# Patient Record
Sex: Male | Born: 1935 | Race: Black or African American | Hispanic: No | Smoking: Former smoker
Health system: Southern US, Community
[De-identification: ages and names within clinical notes are randomized; demographics above are authoritative.]

## PROBLEM LIST (undated history)

## (undated) DIAGNOSIS — E785 Hyperlipidemia, unspecified: Secondary | ICD-10-CM

## (undated) DIAGNOSIS — I480 Paroxysmal atrial fibrillation: Secondary | ICD-10-CM

## (undated) DIAGNOSIS — K219 Gastro-esophageal reflux disease without esophagitis: Secondary | ICD-10-CM

## (undated) DIAGNOSIS — I10 Essential (primary) hypertension: Secondary | ICD-10-CM

## (undated) DIAGNOSIS — E119 Type 2 diabetes mellitus without complications: Secondary | ICD-10-CM

## (undated) DIAGNOSIS — H409 Unspecified glaucoma: Secondary | ICD-10-CM

## (undated) HISTORY — DX: Unspecified glaucoma: H40.9

## (undated) HISTORY — DX: Type 2 diabetes mellitus without complications: E11.9

## (undated) HISTORY — DX: Hyperlipidemia, unspecified: E78.5

## (undated) HISTORY — DX: Essential (primary) hypertension: I10

## (undated) HISTORY — DX: Paroxysmal atrial fibrillation: I48.0

## (undated) HISTORY — DX: Gastro-esophageal reflux disease without esophagitis: K21.9

---

## 2004-04-09 ENCOUNTER — Emergency Department: Payer: Self-pay | Admitting: Emergency Medicine

## 2004-05-01 ENCOUNTER — Ambulatory Visit: Payer: Self-pay

## 2004-05-16 ENCOUNTER — Ambulatory Visit: Payer: Self-pay

## 2005-06-17 ENCOUNTER — Inpatient Hospital Stay: Payer: Self-pay | Admitting: Internal Medicine

## 2005-06-17 ENCOUNTER — Other Ambulatory Visit: Payer: Self-pay

## 2005-06-19 ENCOUNTER — Other Ambulatory Visit: Payer: Self-pay

## 2005-06-30 ENCOUNTER — Emergency Department: Payer: Self-pay | Admitting: Emergency Medicine

## 2006-04-20 ENCOUNTER — Ambulatory Visit: Payer: Self-pay | Admitting: Family Medicine

## 2007-05-19 ENCOUNTER — Ambulatory Visit: Payer: Self-pay | Admitting: Family Medicine

## 2009-09-13 ENCOUNTER — Ambulatory Visit: Payer: Self-pay | Admitting: Family Medicine

## 2009-10-11 ENCOUNTER — Ambulatory Visit: Payer: Self-pay | Admitting: Family Medicine

## 2012-03-27 IMAGING — CR ORBITS FOR FOREIGN BODY - 2 VIEW
1 series · 6 of 6 positions shown · non-contrast
Comparison: none

REASON FOR EXAM: METALLIC FOREIGH BODY FOR MRI CLEARANCE
COMMENTS:

PROCEDURE:     DXR - DXR ORBITS FOR MRI CLEARANCE  - October 11, 2009  [DATE]
RESULT:     Three views of the orbits reveal no evidence of retained
metallic foreign bodies. The bony structures are grossly normal.

[Series 1: view not recorded · 0.17mm/px · 6 of 6 slices shown]
[im 1/6]
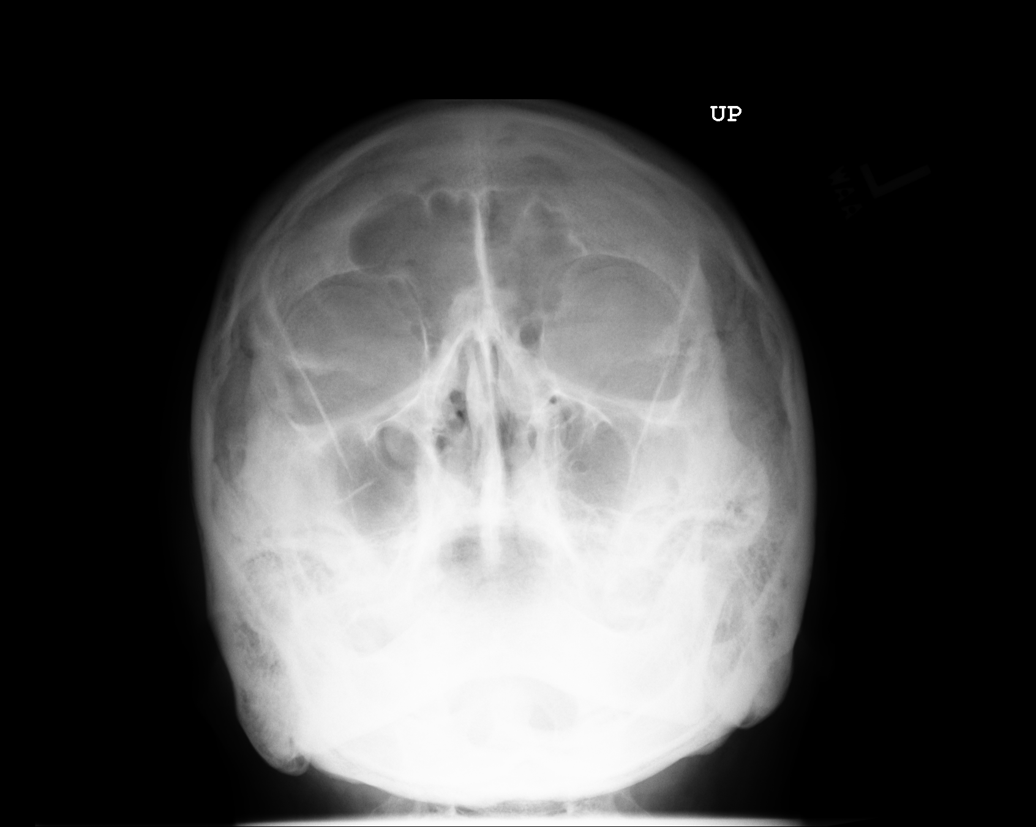
[im 2/6]
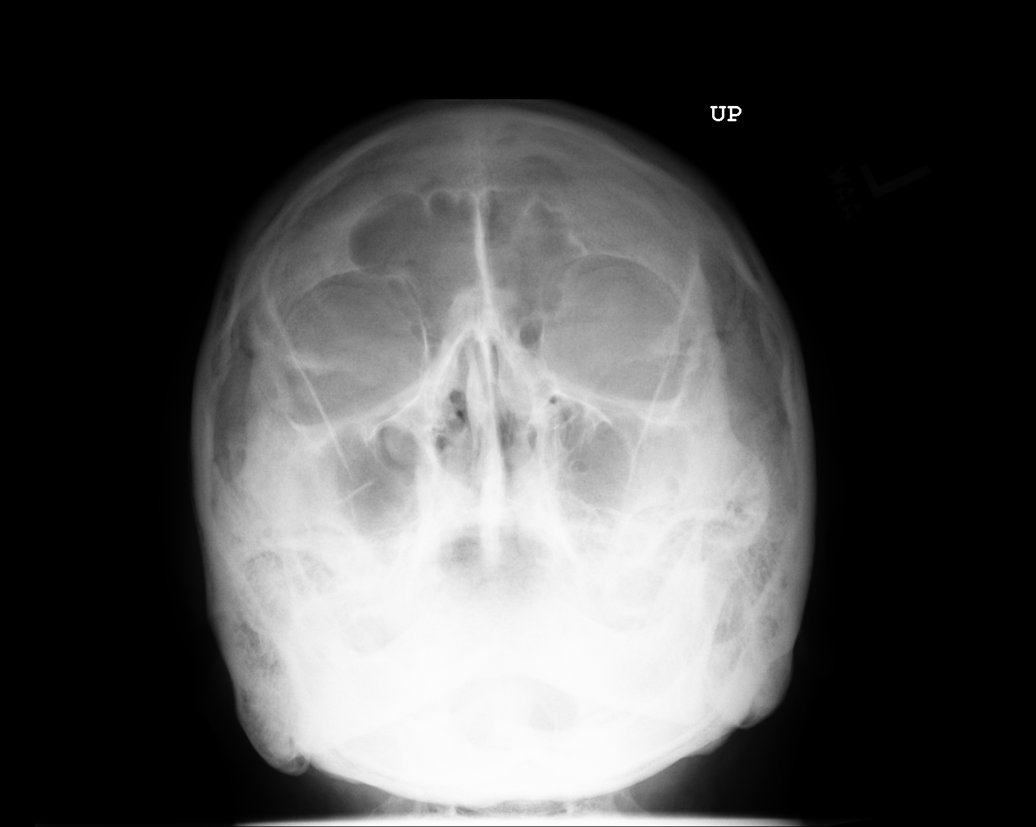
[im 3/6]
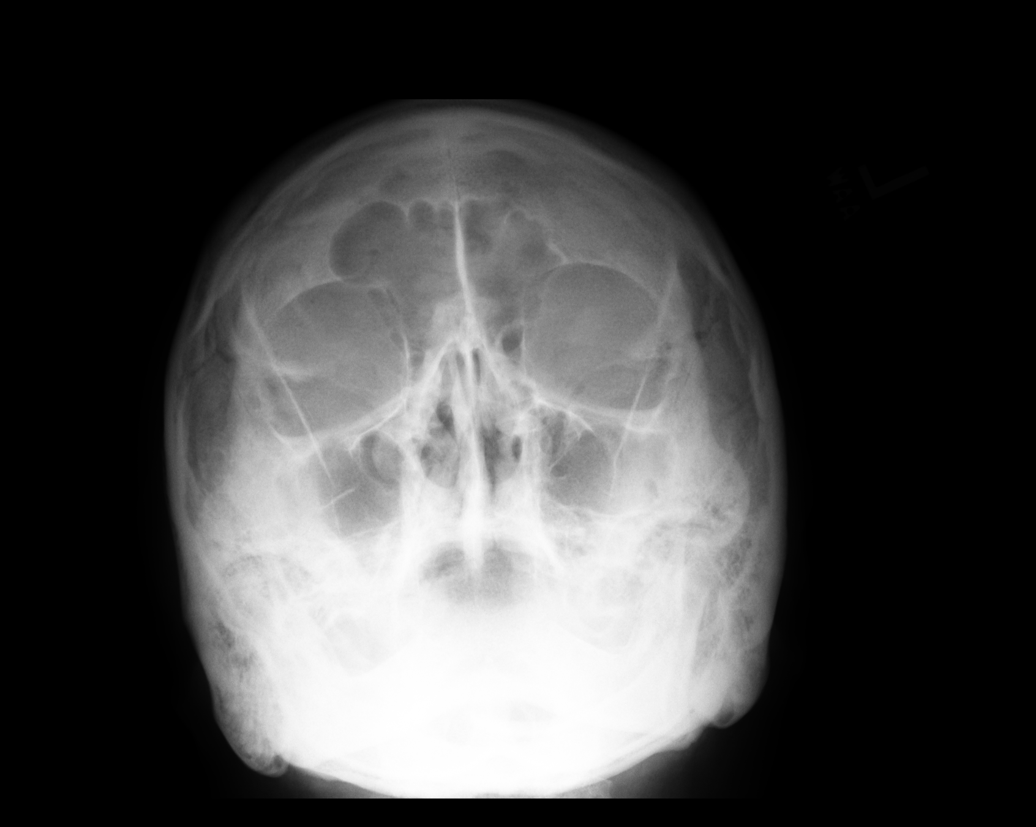
[im 4/6]
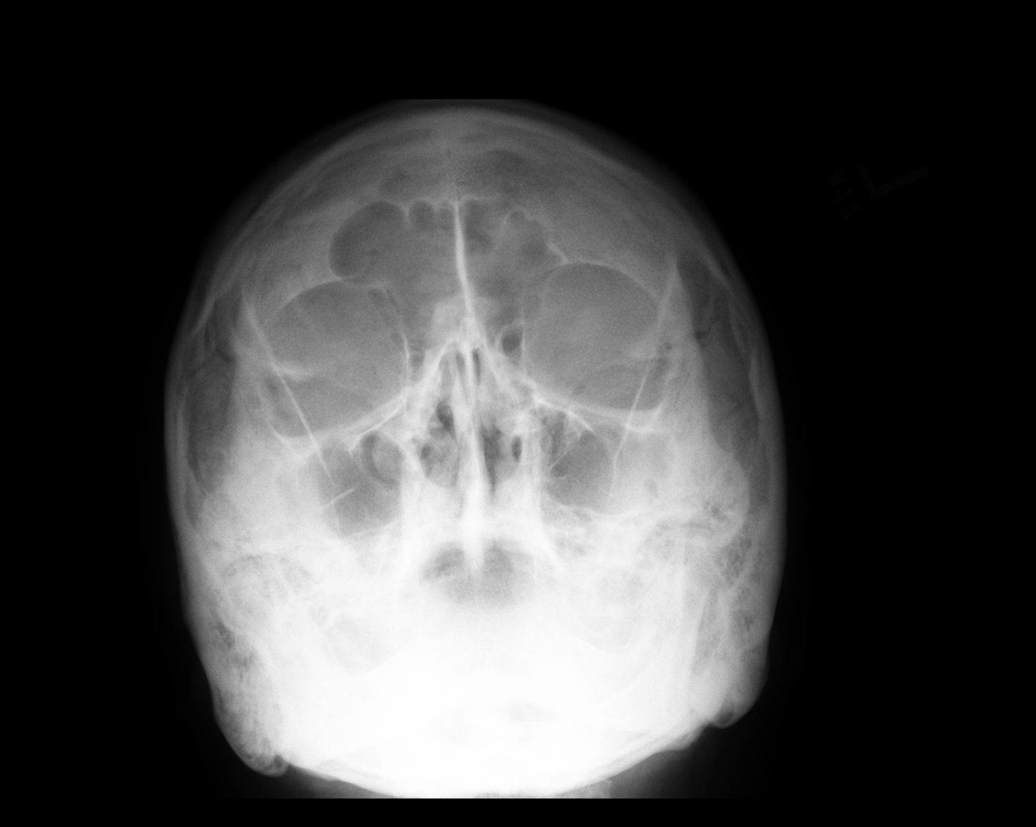
[im 5/6]
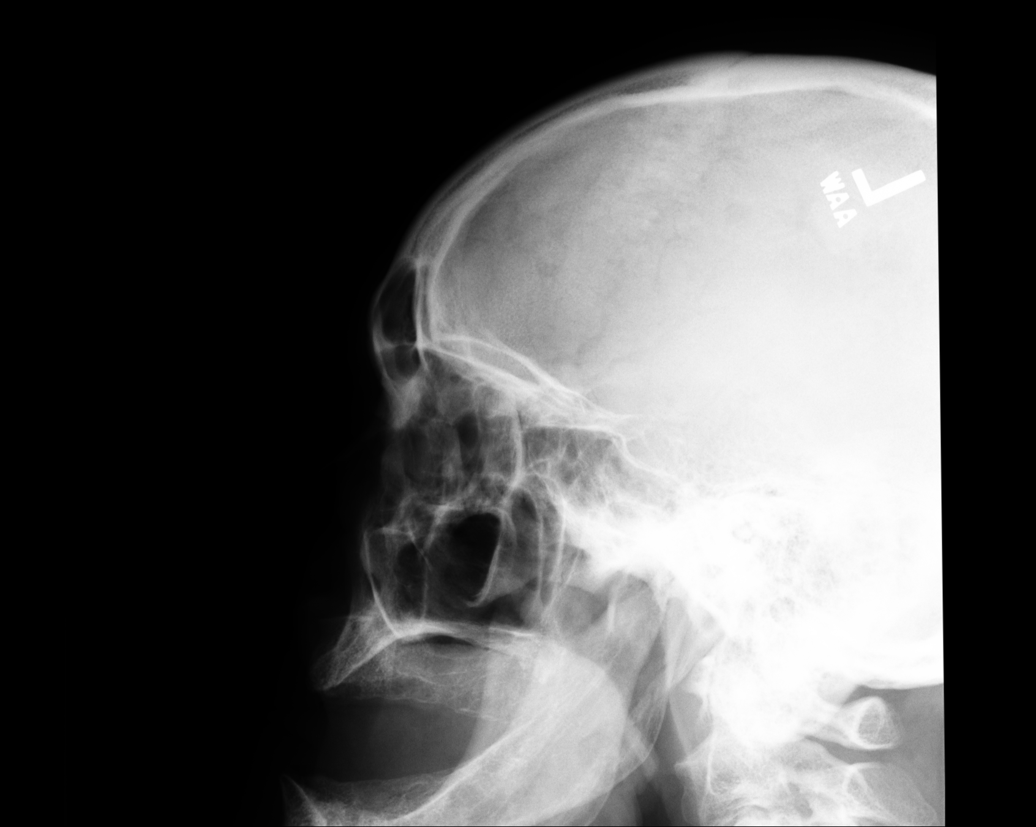
[im 6/6]
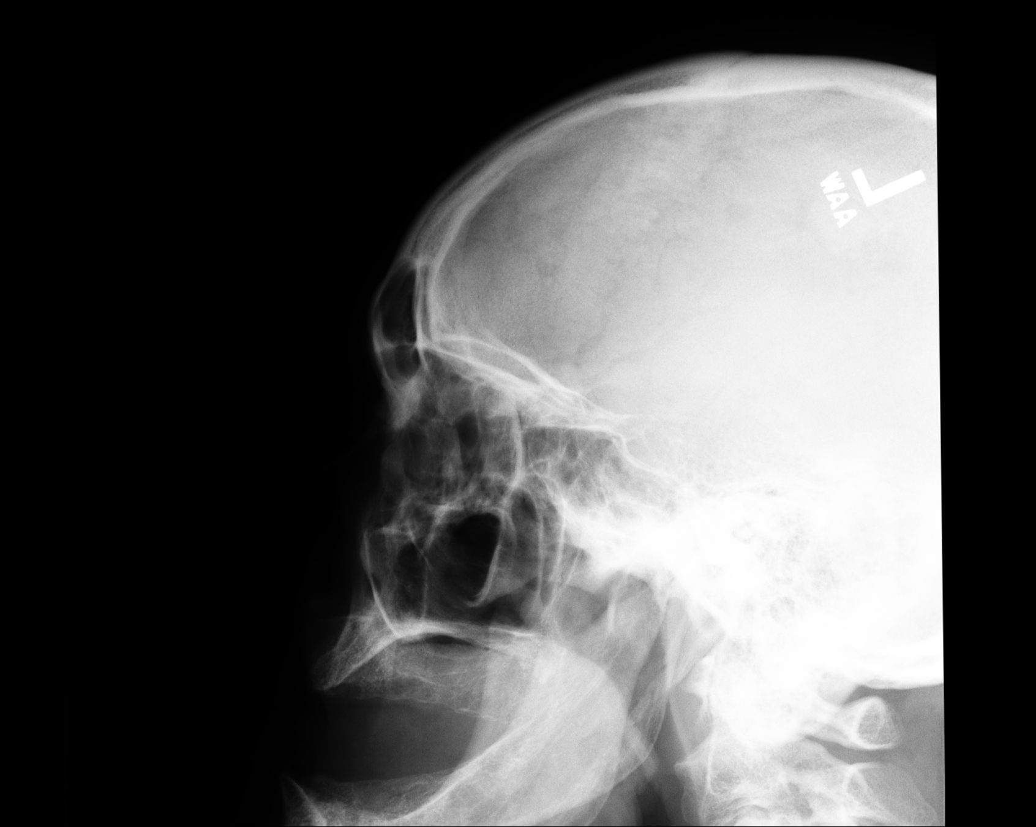

[6 of 6 positions shown; findings below may reference images not displayed]

IMPRESSION: I do not see evidence of retained metallic foreign bodies
over the orbits. I see no contraindication to MRI.

## 2013-09-15 ENCOUNTER — Ambulatory Visit: Payer: Self-pay | Admitting: Ophthalmology

## 2013-09-15 DIAGNOSIS — I10 Essential (primary) hypertension: Secondary | ICD-10-CM

## 2013-09-15 DIAGNOSIS — Z01810 Encounter for preprocedural cardiovascular examination: Secondary | ICD-10-CM

## 2013-09-15 LAB — POTASSIUM: Potassium: 3.7 mmol/L (ref 3.5–5.1)

## 2013-09-29 ENCOUNTER — Ambulatory Visit: Payer: Self-pay | Admitting: Ophthalmology

## 2013-11-17 ENCOUNTER — Ambulatory Visit: Payer: Self-pay | Admitting: Ophthalmology

## 2013-11-17 LAB — POTASSIUM: Potassium: 3.9 mmol/L (ref 3.5–5.1)

## 2013-11-24 ENCOUNTER — Ambulatory Visit: Payer: Self-pay | Admitting: Ophthalmology

## 2014-01-30 DIAGNOSIS — H40003 Preglaucoma, unspecified, bilateral: Secondary | ICD-10-CM | POA: Diagnosis not present

## 2014-02-28 DIAGNOSIS — I4430 Unspecified atrioventricular block: Secondary | ICD-10-CM | POA: Diagnosis not present

## 2014-02-28 DIAGNOSIS — I495 Sick sinus syndrome: Secondary | ICD-10-CM | POA: Diagnosis not present

## 2014-02-28 DIAGNOSIS — I208 Other forms of angina pectoris: Secondary | ICD-10-CM | POA: Diagnosis not present

## 2014-02-28 DIAGNOSIS — R001 Bradycardia, unspecified: Secondary | ICD-10-CM | POA: Diagnosis not present

## 2014-02-28 DIAGNOSIS — R011 Cardiac murmur, unspecified: Secondary | ICD-10-CM | POA: Diagnosis not present

## 2014-02-28 DIAGNOSIS — I480 Paroxysmal atrial fibrillation: Secondary | ICD-10-CM | POA: Diagnosis not present

## 2014-02-28 DIAGNOSIS — I440 Atrioventricular block, first degree: Secondary | ICD-10-CM | POA: Diagnosis not present

## 2014-03-10 DIAGNOSIS — I480 Paroxysmal atrial fibrillation: Secondary | ICD-10-CM | POA: Diagnosis not present

## 2014-03-10 DIAGNOSIS — R001 Bradycardia, unspecified: Secondary | ICD-10-CM | POA: Diagnosis not present

## 2014-03-10 DIAGNOSIS — R011 Cardiac murmur, unspecified: Secondary | ICD-10-CM | POA: Diagnosis not present

## 2014-03-10 DIAGNOSIS — I208 Other forms of angina pectoris: Secondary | ICD-10-CM | POA: Diagnosis not present

## 2014-03-29 DIAGNOSIS — I480 Paroxysmal atrial fibrillation: Secondary | ICD-10-CM | POA: Diagnosis not present

## 2014-03-29 DIAGNOSIS — I10 Essential (primary) hypertension: Secondary | ICD-10-CM | POA: Diagnosis not present

## 2014-03-29 DIAGNOSIS — R001 Bradycardia, unspecified: Secondary | ICD-10-CM | POA: Diagnosis not present

## 2014-03-29 DIAGNOSIS — I495 Sick sinus syndrome: Secondary | ICD-10-CM | POA: Diagnosis not present

## 2014-03-30 DIAGNOSIS — R001 Bradycardia, unspecified: Secondary | ICD-10-CM | POA: Diagnosis not present

## 2014-03-30 DIAGNOSIS — I480 Paroxysmal atrial fibrillation: Secondary | ICD-10-CM | POA: Diagnosis not present

## 2014-04-29 NOTE — Op Note (Signed)
PATIENT NAME:  Charles Valencia, Charles Valencia MR#:  [id number] DATE OF BIRTH:  1931/02/26  DATE OF PROCEDURE:  11/24/2013  PREOPERATIVE DIAGNOSIS:  Nuclear sclerotic cataract,  right eye. (H25.11)  POSTOPERATIVE DIAGNOSIS:  Nuclear sclerotic cataract, right eye. (H25.11)  PROCEDURE:  Phacoemulsification with posterior chamber intraocular lens right eye, model SN60WF  SURGEON:  Lyla Glassing, MD  INDICATIONS:  This is an 81 year old male with decreased vision in the right eye.  PROCEDURE:  The risks and benefits of cataract surgery were discussed at length with the patient, including bleeding, infection, retinal detachment, re-operation, diplopia, ptosis, loss of vision, and loss of the eye. Informed consent was obtained. On the day of surgery, several sets of preoperative medication were administered to the operative eye including 0.5% tetracaine,1% cyclopentolate, 10% phenylephrine, 0.5% ketorolac, 0.5% gatifloxacin, and 2% lidocaine .  The patient was taken to the operating room and sedated via IV sedation. Topical tetracaine was placed in the eye. The operative eye was prepped using a 10% Betadine solution and then covered in sterile drapes leaving only the operative eye exposed. A Lieberman lid speculum was placed to provide exposure. Using 0.12 forceps and a side-port blade, a paracentesis was created. Then a mixture of BSS, preservative free lidocaine, and epinephrine was injected into the anterior chamber. Next, a 2.4 mm keratome blade was used to create a two-step full-thickness clear corneal incision temporally. The cystitome and Utrata forceps were used to create a continuous capsulorrhexis in the anterior lens capsule. BSS on a hydrodissection cannula was used to perform gentle hydrodissection. Phacoemulsification was then performed to remove the nucleus. Irrigation and aspiration was performed to remove the remaining cortical material. Provisc was injected to fill the capsular bag and anterior  chamber. A 18.0-diopter SN60WF intraocular lens was injected into the capsular bag. The Connor wand was used to rotate it into proper position in the capsular bag. Irrigation and aspiration was performed to remove the remaining Viscoelastic material from the eye. BSS on a 30-gauge cannula was used to hydrate the wound. An intracameral antibiotic was administered. The main wound was closed with two 10-0 nylon sutures. The wounds were checked and found to be watertight. The lid speculum and drapes were carefully removed. Several drops of Vigamox were placed in the operative eye. The eye was covered with protective eyewear. The patient was taken to the recovery area in good condition. There were no complications.  ____________________________ Lyla Glassing, MD nm:sb D: 11/24/2013 11:13:24 ET T: 11/24/2013 15:28:52 ET JOB#: [id number]  cc: Lyla Glassing, MD, <Dictator> Lyla Glassing MD ELECTRONICALLY SIGNED 12/15/2013 12:12

## 2014-04-29 NOTE — Op Note (Signed)
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  PROCEDURE: Phacoemulsification with posterior chamber intraocular lens in the left eye, model SN60WF 17.5 diopter.  SURGEON: Lyla Glassing, MD  INDICATIONS: This is an 81 year old male with decreased vision in his left eye due to senile cataract.  PROCEDURE: The risks and benefits of cataract surgery were discussed at length with the patient including bleeding, infection, retinal detachment, reoperation, diplopia, ptosis, loss of vision and loss of the eye. Informed consent was obtained. On the day of surgery, he received several sets of preoperative medication in the left eye including 0.5% tetracaine, 1% cyclopentolate, 10% phenylephrine, 0.5% ketorolac, 0.5% gatifloxacin, and 2% lidocaine. He was taken to the operating room and sedated by IV sedation. Topical tetracaine was placed in the eye. The operative eye was prepped using a 10% Betadine solution. He was covered in sterile drapes leaving only the operative eye exposed. A Lieberman lid speculum was placed to provide exposure. Using 0.12 forceps and a sideport blade, a paracentesis was created. Then, a mixture of BSS preservative free lidocaine and epinephrine was injected into the anterior chamber. Next, a 2.4 mm keratome blade was used to create a 2 stab fold thickness clear corneal incision temporally. The cystotome and Utrata forceps were used to create a continuous curvilinear capsulorrhexis in the anterior lens capsule. BSS on hydrodissection cannula was used to perform gentle hydrodissection. Phacoemulsification was then performed to remove the nucleus. Irrigation and aspiration was performed to remove the remaining cortical material. Provisc was injected to fill the capsular bag and anterior  chamber. A 17.5 diopter SN60WF intraocular lens was injected into the capsular bag. The Conner wand was used to rotate it into the proper position in the capsular bag. Irrigation and aspiration was performed to remove the remaining viscoelastic material from the eye. BSS on a 30 gauge cannula was used to hydrate the wound. Intracameral antibiotics were administered. The wounds were checked and found to be water tight. The lid speculum and drapes were carefully removed. Several drops of Vigamox was placed in the operative eye. The eye was covered with protective eyewear. The patient was taken to the recovery area in good condition. There were no complications.   ____________________________ Lyla Glassing, MD nm:TT D: 09/29/2013 13:29:13 ET T: 09/29/2013 14:24:47 ET JOB#: [id number]  cc: Lyla Glassing, MD, <Dictator> Lyla Glassing MD ELECTRONICALLY SIGNED 10/03/2013 13:52

## 2014-06-09 ENCOUNTER — Other Ambulatory Visit: Payer: Self-pay | Admitting: Family Medicine

## 2014-06-13 ENCOUNTER — Encounter: Payer: Self-pay | Admitting: Family Medicine

## 2014-06-14 ENCOUNTER — Ambulatory Visit (INDEPENDENT_AMBULATORY_CARE_PROVIDER_SITE_OTHER): Payer: Medicare Other | Admitting: Family Medicine

## 2014-06-14 ENCOUNTER — Encounter: Payer: Self-pay | Admitting: Family Medicine

## 2014-06-14 VITALS — BP 122/80 | HR 84 | Temp 97.9°F | Resp 18 | Ht 70.0 in | Wt 219.8 lb

## 2014-06-14 DIAGNOSIS — I495 Sick sinus syndrome: Secondary | ICD-10-CM | POA: Insufficient documentation

## 2014-06-14 DIAGNOSIS — Z1211 Encounter for screening for malignant neoplasm of colon: Secondary | ICD-10-CM | POA: Diagnosis not present

## 2014-06-14 DIAGNOSIS — Z125 Encounter for screening for malignant neoplasm of prostate: Secondary | ICD-10-CM | POA: Diagnosis not present

## 2014-06-14 DIAGNOSIS — Z0000 Encounter for general adult medical examination without abnormal findings: Secondary | ICD-10-CM

## 2014-06-14 DIAGNOSIS — I4891 Unspecified atrial fibrillation: Secondary | ICD-10-CM | POA: Insufficient documentation

## 2014-06-14 DIAGNOSIS — R0602 Shortness of breath: Secondary | ICD-10-CM | POA: Insufficient documentation

## 2014-06-14 DIAGNOSIS — R001 Bradycardia, unspecified: Secondary | ICD-10-CM | POA: Insufficient documentation

## 2014-06-14 DIAGNOSIS — R9431 Abnormal electrocardiogram [ECG] [EKG]: Secondary | ICD-10-CM | POA: Insufficient documentation

## 2014-06-14 DIAGNOSIS — I10 Essential (primary) hypertension: Secondary | ICD-10-CM | POA: Insufficient documentation

## 2014-06-14 NOTE — Progress Notes (Signed)
Name: Charles Valencia   MRN: [id number]    DOB: Oct 21, 1931   Date:06/14/2014       Progress Note  Subjective  Chief Complaint  Chief Complaint  Patient presents with  . Annual Exam    HPI  81 yo male for annual physical without new problems.   History reviewed. No pertinent past medical history.  History  Substance Use Topics  . Smoking status: Former Research scientist (life sciences)  . Smokeless tobacco: Not on file  . Alcohol Use: No     Current outpatient prescriptions:  .  CRESTOR 5 MG tablet, , Disp: , Rfl:  .  ELIQUIS 2.5 MG TABS tablet, , Disp: , Rfl:  .  esomeprazole (NEXIUM) 40 MG capsule, , Disp: , Rfl:  .  valsartan-hydrochlorothiazide (DIOVAN-HCT) 160-25 MG per tablet, TAKE ONE (1) TABLET EACH DAY, Disp: 30 tablet, Rfl: 3  No Known Allergies  Review of Systems  Constitutional: Negative for fever, chills and weight loss.  HENT: Negative for congestion, hearing loss, sore throat and tinnitus.   Eyes: Negative for blurred vision, double vision and redness.  Respiratory: Negative for cough, hemoptysis and shortness of breath.   Cardiovascular: Negative for chest pain, palpitations, orthopnea, claudication and leg swelling.  Gastrointestinal: Negative for heartburn, nausea, vomiting, diarrhea, constipation and blood in stool.  Genitourinary: Negative for dysuria, urgency, frequency and hematuria.  Musculoskeletal: Negative for myalgias, back pain, joint pain, falls and neck pain.  Skin: Negative for itching.  Neurological: Negative for dizziness, tingling, tremors, focal weakness, seizures, loss of consciousness, weakness and headaches.  Endo/Heme/Allergies: Does not bruise/bleed easily.  Psychiatric/Behavioral: Negative for depression and substance abuse. The patient is not nervous/anxious and does not have insomnia.      Objective  Filed Vitals:   06/14/14 1054  BP: 122/80  Pulse: 84  Temp: 97.9 F (36.6 C)  TempSrc: Oral  Resp: 18  Height: 5\' 10"  (1.778 m)  Weight: 219 lb 12.8  oz (99.701 kg)  SpO2: 97%     Physical Exam  Constitutional: He is oriented to person, place, and time and well-developed, well-nourished, and in no distress.  HENT:  Head: Normocephalic.  Eyes: EOM are normal. Pupils are equal, round, and reactive to light.  Neck: Normal range of motion. Neck supple. No thyromegaly present.  Cardiovascular: Normal rate, regular rhythm and normal heart sounds.   No murmur heard. Pulmonary/Chest: Effort normal and breath sounds normal. No respiratory distress. He has no wheezes.  Abdominal: Soft. Bowel sounds are normal.  Genitourinary: Rectum normal, prostate normal and penis normal. Guaiac negative stool. No discharge found.  Musculoskeletal: Normal range of motion. He exhibits no edema.  Lymphadenopathy:    He has no cervical adenopathy.  Neurological: He is alert and oriented to person, place, and time. No cranial nerve deficit. Gait normal. Coordination normal.  Skin: Skin is warm and dry. No rash noted.  Psychiatric: Affect and judgment normal.      Assessment & Plan

## 2014-06-19 LAB — POC HEMOCCULT BLD/STL (OFFICE/1-CARD/DIAGNOSTIC): FECAL OCCULT BLD: NEGATIVE

## 2014-07-07 ENCOUNTER — Other Ambulatory Visit: Payer: Self-pay | Admitting: Family Medicine

## 2014-07-31 DIAGNOSIS — H40003 Preglaucoma, unspecified, bilateral: Secondary | ICD-10-CM | POA: Diagnosis not present

## 2014-09-08 ENCOUNTER — Other Ambulatory Visit: Payer: Self-pay | Admitting: Family Medicine

## 2014-09-27 DIAGNOSIS — R001 Bradycardia, unspecified: Secondary | ICD-10-CM | POA: Diagnosis not present

## 2014-09-27 DIAGNOSIS — I480 Paroxysmal atrial fibrillation: Secondary | ICD-10-CM | POA: Diagnosis not present

## 2014-09-27 DIAGNOSIS — I10 Essential (primary) hypertension: Secondary | ICD-10-CM | POA: Diagnosis not present

## 2014-09-27 DIAGNOSIS — R0602 Shortness of breath: Secondary | ICD-10-CM | POA: Diagnosis not present

## 2014-09-27 DIAGNOSIS — I495 Sick sinus syndrome: Secondary | ICD-10-CM | POA: Diagnosis not present

## 2014-10-09 ENCOUNTER — Telehealth: Payer: Self-pay | Admitting: Emergency Medicine

## 2014-10-09 ENCOUNTER — Other Ambulatory Visit: Payer: Self-pay | Admitting: Family Medicine

## 2014-10-09 ENCOUNTER — Telehealth: Payer: Self-pay | Admitting: Family Medicine

## 2014-10-09 MED ORDER — APIXABAN 2.5 MG PO TABS: 2.5000 mg | ORAL_TABLET | Freq: Two times a day (BID) | ORAL | Status: DC

## 2014-10-09 NOTE — Telephone Encounter (Signed)
Patient has appointment scheduled for 10-17-14 but is needing a refill on Eliquis. [facility]

## 2014-10-17 ENCOUNTER — Encounter: Payer: Self-pay | Admitting: Family Medicine

## 2014-10-17 ENCOUNTER — Ambulatory Visit (INDEPENDENT_AMBULATORY_CARE_PROVIDER_SITE_OTHER): Payer: Medicare Other | Admitting: Family Medicine

## 2014-10-17 VITALS — BP 132/80 | HR 74 | Temp 97.9°F | Resp 18 | Ht 70.0 in | Wt 215.3 lb

## 2014-10-17 DIAGNOSIS — I480 Paroxysmal atrial fibrillation: Secondary | ICD-10-CM

## 2014-10-17 DIAGNOSIS — R0602 Shortness of breath: Secondary | ICD-10-CM

## 2014-10-17 DIAGNOSIS — R739 Hyperglycemia, unspecified: Secondary | ICD-10-CM

## 2014-10-17 DIAGNOSIS — I10 Essential (primary) hypertension: Secondary | ICD-10-CM | POA: Diagnosis not present

## 2014-10-17 DIAGNOSIS — I495 Sick sinus syndrome: Secondary | ICD-10-CM | POA: Diagnosis not present

## 2014-10-17 DIAGNOSIS — E785 Hyperlipidemia, unspecified: Secondary | ICD-10-CM | POA: Diagnosis not present

## 2014-10-17 DIAGNOSIS — I4891 Unspecified atrial fibrillation: Secondary | ICD-10-CM | POA: Insufficient documentation

## 2014-10-17 LAB — GLUCOSE, POCT (MANUAL RESULT ENTRY): POC Glucose: 154 mg/dl — AB (ref 70–99)

## 2014-10-17 LAB — POCT GLYCOSYLATED HEMOGLOBIN (HGB A1C): Hemoglobin A1C: 6.2

## 2014-10-17 NOTE — Progress Notes (Signed)
Name: Charles Valencia   MRN: [id number]    DOB: [birth year]   Date:10/17/2014       Progress Note  Subjective  Chief Complaint  Chief Complaint  Patient presents with  . Hypertension  . Hyperlipidemia  . Transitoin into Care    Dr. Clayborn Bigness at [facility]    HPI  Hypertension   Patient presents for follow-up of hypertension. It has been present for over 5 years.  Patient states that there is compliance with medical regimen which consists of Diovan HCT 1 60-25 once daily . There is no end organ disease. Cardiac risk factors include hypertension hyperlipidemia and diabetes.  Exercise regimen consist of some walking .  Diet consist of occasionally unappropriate foods .  Hyperlipidemia  Patient has a history of hyperlipidemia for over 5 years.  Current medical regimen consist of Crestor 5 mg daily at bedtime .  Compliance is good .  Diet and exercise are currently followed on occasion .  Risk factors for cardiovascular disease include hyperlipidemia, hypertension, sedentary lifestyle, age .   There have been no side effects from the medication.   History reviewed. No pertinent past medical history.    Atrial fibrillation  Patient has had paroxysmal atrial fibrillation. He is currently being followed by his cardiologist and is currently on a regimen consist of ELIQuis on with aspirin 81 mg once daily.     Social History  Substance Use Topics  . Smoking status: Former Research scientist (life sciences)  . Smokeless tobacco: Not on file  . Alcohol Use: No     Current outpatient prescriptions:  .  apixaban (ELIQUIS) 2.5 MG TABS tablet, Take 1 tablet (2.5 mg total) by mouth 2 (two) times daily., Disp: 60 tablet, Rfl: 5 .  CRESTOR 5 MG tablet, TAKE 1 TABLET BY MOUTH EVERY DAY., Disp: 30 tablet, Rfl: 2 .  esomeprazole (NEXIUM) 40 MG capsule, , Disp: , Rfl:  .  valsartan-hydrochlorothiazide (DIOVAN-HCT) 160-25 MG per tablet, TAKE ONE (1) TABLET EACH DAY, Disp: 30 tablet, Rfl: 3  No Known  Allergies  Review of Systems  Constitutional: Negative for fever, chills and weight loss.  HENT: Negative for congestion, hearing loss, sore throat and tinnitus.   Eyes: Negative for blurred vision, double vision and redness.  Respiratory: Positive for shortness of breath. Negative for cough and hemoptysis.   Cardiovascular: Positive for palpitations and leg swelling. Negative for chest pain, orthopnea and claudication.  Gastrointestinal: Negative for heartburn, nausea, vomiting, diarrhea, constipation and blood in stool.  Genitourinary: Negative for dysuria, urgency, frequency and hematuria.  Musculoskeletal: Positive for joint pain. Negative for myalgias, back pain, falls and neck pain.  Skin: Negative for itching.  Neurological: Negative for dizziness, tingling, tremors, focal weakness, seizures, loss of consciousness, weakness and headaches.  Endo/Heme/Allergies: Does not bruise/bleed easily.  Psychiatric/Behavioral: Negative for depression and substance abuse. The patient is not nervous/anxious and does not have insomnia.      Objective  Filed Vitals:   10/17/14 0944  BP: 132/80  Pulse: 74  Temp: 97.9 F (36.6 C)  TempSrc: Oral  Resp: 18  Height: 5\' 10"  (1.778 m)  Weight: 215 lb 4.8 oz (97.659 kg)  SpO2: 98%     Physical Exam  Constitutional: He is oriented to person, place, and time and well-developed, well-nourished, and in no distress.  HENT:  Head: Normocephalic.  Eyes: EOM are normal. Pupils are equal, round, and reactive to light.  Neck: Normal range of motion. Neck supple. No thyromegaly present.  Cardiovascular: Normal  heart sounds.   No murmur heard. Irregularly irregular rate and rhythm  Pulmonary/Chest: Effort normal and breath sounds normal. No respiratory distress. He has no wheezes.  Abdominal: Soft. Bowel sounds are normal.  Musculoskeletal: Normal range of motion. He exhibits no edema.  Lymphadenopathy:    He has no cervical adenopathy.   Neurological: He is alert and oriented to person, place, and time. No cranial nerve deficit. Gait normal. Coordination normal.  Skin: Skin is warm and dry. No rash noted.  Psychiatric: Affect and judgment normal.      Assessment & Plan  1. Paroxysmal atrial fibrillation (HCC) Currently controlled atrial fibrillation - CBC - Comprehensive Metabolic Panel (CMET) - TSH  2. Essential hypertension Well-controlled - CBC - Comprehensive Metabolic Panel (CMET)  3. Sick sinus syndrome (HCC) Continue cardiology follow-ups - CBC - Comprehensive Metabolic Panel (CMET) - TSH  4. Breath shortness Probably secondary to his paroxysmal atrial fib  5. Hyperglycemia Recheck glucose and hemoglobin A1c - CBC - POCT Glucose (CBG) - POCT HgB A1C  6. Hyperlipidemia Labs today - Lipid panel

## 2014-10-20 ENCOUNTER — Ambulatory Visit (INDEPENDENT_AMBULATORY_CARE_PROVIDER_SITE_OTHER): Payer: Medicare Other

## 2014-10-20 DIAGNOSIS — Z23 Encounter for immunization: Secondary | ICD-10-CM

## 2014-10-20 NOTE — Telephone Encounter (Signed)
Script sent  

## 2014-11-09 ENCOUNTER — Other Ambulatory Visit: Payer: Self-pay | Admitting: Family Medicine

## 2014-12-08 ENCOUNTER — Other Ambulatory Visit: Payer: Self-pay | Admitting: Family Medicine

## 2015-02-09 ENCOUNTER — Other Ambulatory Visit: Payer: Self-pay | Admitting: Family Medicine

## 2015-02-13 DIAGNOSIS — H40003 Preglaucoma, unspecified, bilateral: Secondary | ICD-10-CM | POA: Diagnosis not present

## 2015-02-20 ENCOUNTER — Ambulatory Visit: Payer: Medicare Other | Admitting: Family Medicine

## 2015-03-09 ENCOUNTER — Other Ambulatory Visit: Payer: Self-pay | Admitting: Family Medicine

## 2015-03-19 ENCOUNTER — Ambulatory Visit: Payer: Medicare Other | Admitting: Family Medicine

## 2015-03-26 DIAGNOSIS — I495 Sick sinus syndrome: Secondary | ICD-10-CM | POA: Diagnosis not present

## 2015-03-26 DIAGNOSIS — R001 Bradycardia, unspecified: Secondary | ICD-10-CM | POA: Diagnosis not present

## 2015-03-26 DIAGNOSIS — I480 Paroxysmal atrial fibrillation: Secondary | ICD-10-CM | POA: Diagnosis not present

## 2015-03-26 DIAGNOSIS — I10 Essential (primary) hypertension: Secondary | ICD-10-CM | POA: Diagnosis not present

## 2015-03-26 DIAGNOSIS — R0602 Shortness of breath: Secondary | ICD-10-CM | POA: Diagnosis not present

## 2015-04-09 ENCOUNTER — Other Ambulatory Visit: Payer: Self-pay | Admitting: Family Medicine

## 2015-04-12 ENCOUNTER — Ambulatory Visit: Payer: Medicare Other | Admitting: Family Medicine

## 2015-04-23 ENCOUNTER — Ambulatory Visit (INDEPENDENT_AMBULATORY_CARE_PROVIDER_SITE_OTHER): Payer: Medicare Other | Admitting: Family Medicine

## 2015-04-23 ENCOUNTER — Encounter: Payer: Self-pay | Admitting: Family Medicine

## 2015-04-23 VITALS — BP 128/72 | HR 79 | Temp 97.5°F | Resp 17 | Ht 70.0 in | Wt 212.4 lb

## 2015-04-23 DIAGNOSIS — I480 Paroxysmal atrial fibrillation: Secondary | ICD-10-CM | POA: Diagnosis not present

## 2015-04-23 DIAGNOSIS — W19XXXA Unspecified fall, initial encounter: Secondary | ICD-10-CM | POA: Diagnosis not present

## 2015-04-23 DIAGNOSIS — I499 Cardiac arrhythmia, unspecified: Secondary | ICD-10-CM | POA: Insufficient documentation

## 2015-04-23 DIAGNOSIS — Y92099 Unspecified place in other non-institutional residence as the place of occurrence of the external cause: Secondary | ICD-10-CM

## 2015-04-23 DIAGNOSIS — Y92009 Unspecified place in unspecified non-institutional (private) residence as the place of occurrence of the external cause: Secondary | ICD-10-CM | POA: Insufficient documentation

## 2015-04-23 DIAGNOSIS — Z7689 Persons encountering health services in other specified circumstances: Secondary | ICD-10-CM | POA: Diagnosis not present

## 2015-04-23 NOTE — Progress Notes (Signed)
Name: Charles Valencia   MRN: [id number]    DOB: 14-May-1931   Date:04/23/2015       Progress Note  Subjective  Chief Complaint  Chief Complaint  Patient presents with  . Acute Visit    Falling    HPI  Fall: Pt. Fell 2 weeks ago at his home, does not remember any symptoms prior to fall, he hit his R. Shoulder on the concrete floor, no loss of consciousness. No other recent falls. Pt. lives alone by himself at his house in [place], [place].  Since the fall, he reports no bruising or pain over the shoulder.  Past Medical History  Diagnosis Date  . GERD (gastroesophageal reflux disease)   . Paroxysmal atrial fibrillation ([place])     Followed by Cardiology.  . Hyperlipidemia   . Hypertension     History reviewed. No pertinent past surgical history.  History reviewed. No pertinent family history.  Social History   Social History  . Marital Status: Single    Spouse Name: N/A  . Number of Children: N/A  . Years of Education: N/A   Occupational History  . Not on file.   Social History Main Topics  . Smoking status: Former Research scientist (life sciences)  . Smokeless tobacco: Not on file  . Alcohol Use: No  . Drug Use: No  . Sexual Activity: No   Other Topics Concern  . Not on file   Social History Narrative     Current outpatient prescriptions:  .  apixaban (ELIQUIS) 2.5 MG TABS tablet, Take 1 tablet (2.5 mg total) by mouth 2 (two) times daily., Disp: 60 tablet, Rfl: 5 .  ELIQUIS 2.5 MG TABS tablet, TAKE 1 TABLET BY MOUTH TWICE A DAY., Disp: 60 tablet, Rfl: 1 .  esomeprazole (NEXIUM) 40 MG capsule, TAKE ONE (1) CAPSULE EACH DAY, Disp: 90 capsule, Rfl: 1 .  rosuvastatin (CRESTOR) 5 MG tablet, TAKE 1 TABLET BY MOUTH EVERY DAY., Disp: 30 tablet, Rfl: 1 .  valsartan-hydrochlorothiazide (DIOVAN-HCT) 160-25 MG tablet, TAKE ONE TABLET BY MOUTH DAILY, Disp: 30 tablet, Rfl: 5  No Known Allergies   Review of Systems  Constitutional: Negative for fever, chills and malaise/fatigue.  Eyes: Negative for  blurred vision and double vision.  Respiratory: Negative for cough and shortness of breath.   Cardiovascular: Negative for chest pain.  Gastrointestinal: Negative for abdominal pain, blood in stool and melena.  Genitourinary: Negative for dysuria and frequency.  Musculoskeletal: Negative for back pain and joint pain.  Neurological: Negative for dizziness and headaches.    Objective  Filed Vitals:   04/23/15 0956  BP: 128/72  Pulse: 79  Temp: 97.5 F (36.4 C)  TempSrc: Oral  Resp: 17  Height: 5\' 10"  (1.778 m)  Weight: 212 lb 6.4 oz (96.344 kg)  SpO2: 98%    Physical Exam  Constitutional: He is oriented to person, place, and time and well-developed, well-nourished, and in no distress.  HENT:  Head: Normocephalic and atraumatic.  Eyes: Conjunctivae are normal. Pupils are equal, round, and reactive to light.  Cardiovascular: Normal rate.  An irregular rhythm present.  Pulmonary/Chest: Breath sounds normal. He has no wheezes. He has no rhonchi.  Abdominal: Soft. Bowel sounds are normal.  Neurological: He is alert and oriented to person, place, and time. He has normal sensation, normal strength and intact cranial nerves.  Nursing note and vitals reviewed.     Assessment & Plan  1. Paroxysmal atrial fibrillation (HCC) Atrial fibrillation with PVCs, left bundle branch block, not seen on  previous EKGs. Patient advised to contact his cardiologist for a follow-up appointment ASAP. Verbalized agreement.  - EKG 12-Lead  2. Fall at home, initial encounter Appears well, full range of motion of right shoulder, no other bruises. Obtain basic lab work and follow-up. - CBC with Differential - Comprehensive Metabolic Panel (CMET)   Lilyan Prete [facility] 04/23/2015 10:35 AM

## 2015-04-24 LAB — CBC WITH DIFFERENTIAL/PLATELET
BASOS ABS: 0 10*3/uL (ref 0.0–0.2)
Basos: 0 %
EOS (ABSOLUTE): 0.3 10*3/uL (ref 0.0–0.4)
Eos: 6 %
Hematocrit: 41.5 % (ref 37.5–51.0)
Hemoglobin: 14.2 g/dL (ref 12.6–17.7)
IMMATURE GRANULOCYTES: 0 %
Immature Grans (Abs): 0 10*3/uL (ref 0.0–0.1)
LYMPHS: 19 %
Lymphocytes Absolute: 1 10*3/uL (ref 0.7–3.1)
MCH: 29.9 pg (ref 26.6–33.0)
MCHC: 34.2 g/dL (ref 31.5–35.7)
MCV: 87 fL (ref 79–97)
MONOS ABS: 0.5 10*3/uL (ref 0.1–0.9)
Monocytes: 10 %
NEUTROS PCT: 65 %
Neutrophils Absolute: 3.2 10*3/uL (ref 1.4–7.0)
PLATELETS: 210 10*3/uL (ref 150–379)
RBC: 4.75 x10E6/uL (ref 4.14–5.80)
RDW: 13.7 % (ref 12.3–15.4)
WBC: 5 10*3/uL (ref 3.4–10.8)

## 2015-04-24 LAB — COMPREHENSIVE METABOLIC PANEL
A/G RATIO: 1.6 (ref 1.2–2.2)
ALT: 18 IU/L (ref 0–44)
AST: 25 IU/L (ref 0–40)
Albumin: 4.2 g/dL (ref 3.5–4.7)
Alkaline Phosphatase: 88 IU/L (ref 39–117)
BUN/Creatinine Ratio: 11 (ref 10–24)
BUN: 13 mg/dL (ref 8–27)
Bilirubin Total: 0.9 mg/dL (ref 0.0–1.2)
CALCIUM: 9.6 mg/dL (ref 8.6–10.2)
CO2: 27 mmol/L (ref 18–29)
CREATININE: 1.18 mg/dL (ref 0.76–1.27)
Chloride: 97 mmol/L (ref 96–106)
GFR calc Af Amer: 66 mL/min/{1.73_m2} (ref >59)
GFR, EST NON AFRICAN AMERICAN: 57 mL/min/{1.73_m2} — AB (ref >59)
GLUCOSE: 100 mg/dL — AB (ref 65–99)
Globulin, Total: 2.6 g/dL (ref 1.5–4.5)
POTASSIUM: 4 mmol/L (ref 3.5–5.2)
Sodium: 141 mmol/L (ref 134–144)
Total Protein: 6.8 g/dL (ref 6.0–8.5)

## 2015-04-30 DIAGNOSIS — R42 Dizziness and giddiness: Secondary | ICD-10-CM | POA: Diagnosis not present

## 2015-04-30 DIAGNOSIS — R001 Bradycardia, unspecified: Secondary | ICD-10-CM | POA: Diagnosis not present

## 2015-04-30 DIAGNOSIS — I480 Paroxysmal atrial fibrillation: Secondary | ICD-10-CM | POA: Diagnosis not present

## 2015-04-30 DIAGNOSIS — I495 Sick sinus syndrome: Secondary | ICD-10-CM | POA: Diagnosis not present

## 2015-05-09 ENCOUNTER — Other Ambulatory Visit: Payer: Self-pay | Admitting: Family Medicine

## 2015-05-11 DIAGNOSIS — R42 Dizziness and giddiness: Secondary | ICD-10-CM | POA: Diagnosis not present

## 2015-05-30 DIAGNOSIS — R0602 Shortness of breath: Secondary | ICD-10-CM | POA: Diagnosis not present

## 2015-05-30 DIAGNOSIS — I495 Sick sinus syndrome: Secondary | ICD-10-CM | POA: Diagnosis not present

## 2015-05-30 DIAGNOSIS — I10 Essential (primary) hypertension: Secondary | ICD-10-CM | POA: Diagnosis not present

## 2015-05-30 DIAGNOSIS — R001 Bradycardia, unspecified: Secondary | ICD-10-CM | POA: Diagnosis not present

## 2015-05-30 DIAGNOSIS — I480 Paroxysmal atrial fibrillation: Secondary | ICD-10-CM | POA: Diagnosis not present

## 2015-06-08 ENCOUNTER — Other Ambulatory Visit: Payer: Self-pay | Admitting: Family Medicine

## 2015-06-11 ENCOUNTER — Other Ambulatory Visit: Payer: Self-pay | Admitting: Family Medicine

## 2015-07-09 ENCOUNTER — Other Ambulatory Visit: Payer: Self-pay | Admitting: Family Medicine

## 2015-07-20 ENCOUNTER — Other Ambulatory Visit: Payer: Self-pay | Admitting: Family Medicine

## 2015-09-07 ENCOUNTER — Other Ambulatory Visit: Payer: Self-pay | Admitting: Family Medicine

## 2015-09-13 ENCOUNTER — Ambulatory Visit (INDEPENDENT_AMBULATORY_CARE_PROVIDER_SITE_OTHER): Payer: Medicare Other | Admitting: Family Medicine

## 2015-09-13 ENCOUNTER — Encounter: Payer: Self-pay | Admitting: Family Medicine

## 2015-09-13 VITALS — BP 132/72 | HR 75 | Temp 98.6°F | Resp 16 | Ht 70.0 in | Wt 215.1 lb

## 2015-09-13 DIAGNOSIS — K219 Gastro-esophageal reflux disease without esophagitis: Secondary | ICD-10-CM | POA: Diagnosis not present

## 2015-09-13 DIAGNOSIS — Z23 Encounter for immunization: Secondary | ICD-10-CM

## 2015-09-13 DIAGNOSIS — E785 Hyperlipidemia, unspecified: Secondary | ICD-10-CM | POA: Insufficient documentation

## 2015-09-13 DIAGNOSIS — I10 Essential (primary) hypertension: Secondary | ICD-10-CM | POA: Diagnosis not present

## 2015-09-13 MED ORDER — VALSARTAN-HYDROCHLOROTHIAZIDE 160-25 MG PO TABS: 1.0000 | ORAL_TABLET | Freq: Every day | ORAL | 1 refills | Status: DC

## 2015-09-13 MED ORDER — ROSUVASTATIN CALCIUM 5 MG PO TABS: 5.0000 mg | ORAL_TABLET | Freq: Every day | ORAL | 1 refills | Status: DC

## 2015-09-13 MED ORDER — ESOMEPRAZOLE MAGNESIUM 40 MG PO CPDR: 40.0000 mg | DELAYED_RELEASE_CAPSULE | Freq: Every day | ORAL | 1 refills | Status: DC

## 2015-09-13 NOTE — Progress Notes (Signed)
Name: Charles Valencia   MRN: [id number]    DOB: [birth year]   Date:09/13/2015       Progress Note  Subjective  Chief Complaint  Chief Complaint  Patient presents with  . Medication Refill  . Atrial Fibrillation    follow up     Gastroesophageal Reflux  He reports no abdominal pain, no chest pain, no coughing, no dysphagia, no heartburn or no nausea. This is a chronic problem. The symptoms are aggravated by certain foods. He has tried a PPI for the symptoms. The treatment provided significant relief.  Hyperlipidemia  This is a chronic problem. Recent lipid tests were reviewed and are normal. Pertinent negatives include no chest pain, myalgias or shortness of breath. Current antihyperlipidemic treatment includes statins.  Hypertension  This is a chronic problem. The problem is unchanged. Pertinent negatives include no blurred vision, chest pain, headaches, palpitations or shortness of breath. Past treatments include angiotensin blockers and diuretics. There is no history of kidney disease, CAD/MI or CVA.    Past Medical History:  Diagnosis Date  . GERD (gastroesophageal reflux disease)   . Hyperlipidemia   . Hypertension   . Paroxysmal atrial fibrillation ([place])    Followed by Cardiology.    No past surgical history on file.  No family history on file.  Social History   Social History  . Marital status: Single    Spouse name: N/A  . Number of children: N/A  . Years of education: N/A   Occupational History  . Not on file.   Social History Main Topics  . Smoking status: Former Research scientist (life sciences)  . Smokeless tobacco: Not on file  . Alcohol use No  . Drug use: No  . Sexual activity: No   Other Topics Concern  . Not on file   Social History Narrative  . No narrative on file     Current Outpatient Prescriptions:  .  ELIQUIS 2.5 MG TABS tablet, TAKE 1 TABLET BY MOUTH TWICE A DAY., Disp: 60 tablet, Rfl: 0 .  esomeprazole (NEXIUM) 40 MG capsule, TAKE ONE (1) CAPSULE EACH DAY, Disp:  90 capsule, Rfl: 1 .  rosuvastatin (CRESTOR) 5 MG tablet, TAKE 1 TABLET BY MOUTH EVERY DAY., Disp: 30 tablet, Rfl: 1 .  valsartan-hydrochlorothiazide (DIOVAN-HCT) 160-25 MG tablet, TAKE ONE TABLET BY MOUTH DAILY, Disp: 30 tablet, Rfl: 0  No Known Allergies   Review of Systems  Eyes: Negative for blurred vision.  Respiratory: Negative for cough and shortness of breath.   Cardiovascular: Negative for chest pain and palpitations.  Gastrointestinal: Negative for abdominal pain, dysphagia, heartburn and nausea.  Musculoskeletal: Negative for myalgias.  Neurological: Negative for headaches.    Objective  Vitals:   09/13/15 1428  BP: 132/72  Pulse: 75  Resp: 16  Temp: 98.6 F (37 C)  SpO2: 98%  Weight: 215 lb 2 oz (97.6 kg)  Height: 5\' 10"  (1.778 m)    Physical Exam  Constitutional: He is oriented to person, place, and time and well-developed, well-nourished, and in no distress.  Cardiovascular: Normal rate, regular rhythm, S1 normal and S2 normal.   Murmur heard.  Systolic murmur is present with a grade of 2/6  Pulmonary/Chest: Breath sounds normal. He has no wheezes. He has no rhonchi.  Abdominal: Soft. Bowel sounds are normal. There is no tenderness.  Musculoskeletal:       Right ankle: He exhibits swelling.       Left ankle: He exhibits swelling.  Neurological: He is alert and oriented to person,  place, and time.  Psychiatric: Mood, memory, affect and judgment normal.  Nursing note and vitals reviewed.    Assessment & Plan  1. Essential hypertension BP stable on present antihypertensive therapy - valsartan-hydrochlorothiazide (DIOVAN-HCT) 160-25 MG tablet; Take 1 tablet by mouth daily.  Dispense: 90 tablet; Refill: 1  2. Gastroesophageal reflux disease, esophagitis presence not specified Symptoms stable on PPI therapy - esomeprazole (NEXIUM) 40 MG capsule; Take 1 capsule (40 mg total) by mouth daily at 12 noon.  Dispense: 90 capsule; Refill: 1  3.  Hyperlipidemia  - rosuvastatin (CRESTOR) 5 MG tablet; Take 1 tablet (5 mg total) by mouth daily.  Dispense: 90 tablet; Refill: 1 - Lipid Profile - COMPLETE METABOLIC PANEL WITH GFR   Antoinett Dorman [facility] 09/13/2015 2:49 PM

## 2015-09-25 DIAGNOSIS — E785 Hyperlipidemia, unspecified: Secondary | ICD-10-CM | POA: Diagnosis not present

## 2015-09-26 LAB — LIPID PANEL
CHOLESTEROL: 105 mg/dL — AB (ref 125–200)
HDL: 43 mg/dL (ref >=40)
LDL CALC: 47 mg/dL (ref <130)
TRIGLYCERIDES: 73 mg/dL (ref <150)
Total CHOL/HDL Ratio: 2.4 Ratio (ref <=5.0)
VLDL: 15 mg/dL (ref <30)

## 2015-09-26 LAB — COMPLETE METABOLIC PANEL WITH GFR
ALT: 18 U/L (ref 9–46)
AST: 28 U/L (ref 10–35)
Albumin: 4.1 g/dL (ref 3.6–5.1)
Alkaline Phosphatase: 66 U/L (ref 40–115)
BUN: 19 mg/dL (ref 7–25)
CALCIUM: 9.2 mg/dL (ref 8.6–10.3)
CO2: 28 mmol/L (ref 20–31)
Chloride: 103 mmol/L (ref 98–110)
Creat: 1.17 mg/dL — ABNORMAL HIGH (ref 0.70–1.11)
GFR, EST AFRICAN AMERICAN: 67 mL/min (ref >=60)
GFR, EST NON AFRICAN AMERICAN: 58 mL/min — AB (ref >=60)
Glucose, Bld: 112 mg/dL — ABNORMAL HIGH (ref 65–99)
Potassium: 3.5 mmol/L (ref 3.5–5.3)
Sodium: 141 mmol/L (ref 135–146)
TOTAL PROTEIN: 6.7 g/dL (ref 6.1–8.1)
Total Bilirubin: 1.1 mg/dL (ref 0.2–1.2)

## 2015-10-09 ENCOUNTER — Other Ambulatory Visit: Payer: Self-pay | Admitting: Family Medicine

## 2015-10-09 ENCOUNTER — Other Ambulatory Visit: Payer: Self-pay | Admitting: Emergency Medicine

## 2015-10-09 MED ORDER — APIXABAN 2.5 MG PO TABS: 2.5000 mg | ORAL_TABLET | Freq: Two times a day (BID) | ORAL | 0 refills | Status: DC

## 2015-11-07 DIAGNOSIS — R001 Bradycardia, unspecified: Secondary | ICD-10-CM | POA: Diagnosis not present

## 2015-11-07 DIAGNOSIS — R0602 Shortness of breath: Secondary | ICD-10-CM | POA: Diagnosis not present

## 2015-11-07 DIAGNOSIS — I10 Essential (primary) hypertension: Secondary | ICD-10-CM | POA: Diagnosis not present

## 2015-11-07 DIAGNOSIS — I495 Sick sinus syndrome: Secondary | ICD-10-CM | POA: Diagnosis not present

## 2015-11-07 DIAGNOSIS — I480 Paroxysmal atrial fibrillation: Secondary | ICD-10-CM | POA: Diagnosis not present

## 2015-11-09 ENCOUNTER — Other Ambulatory Visit: Payer: Self-pay | Admitting: Family Medicine

## 2015-12-27 ENCOUNTER — Ambulatory Visit: Payer: Medicare Other | Admitting: Family Medicine

## 2016-01-02 ENCOUNTER — Ambulatory Visit (INDEPENDENT_AMBULATORY_CARE_PROVIDER_SITE_OTHER): Payer: Medicare Other | Admitting: Family Medicine

## 2016-01-02 ENCOUNTER — Encounter: Payer: Self-pay | Admitting: Family Medicine

## 2016-01-02 DIAGNOSIS — E785 Hyperlipidemia, unspecified: Secondary | ICD-10-CM

## 2016-01-02 DIAGNOSIS — K219 Gastro-esophageal reflux disease without esophagitis: Secondary | ICD-10-CM | POA: Diagnosis not present

## 2016-01-02 DIAGNOSIS — I10 Essential (primary) hypertension: Secondary | ICD-10-CM | POA: Diagnosis not present

## 2016-01-02 MED ORDER — ESOMEPRAZOLE MAGNESIUM 40 MG PO CPDR: 40.0000 mg | DELAYED_RELEASE_CAPSULE | Freq: Every day | ORAL | 1 refills | Status: DC

## 2016-01-02 MED ORDER — ROSUVASTATIN CALCIUM 5 MG PO TABS: 5.0000 mg | ORAL_TABLET | Freq: Every day | ORAL | 1 refills | Status: DC

## 2016-01-02 MED ORDER — VALSARTAN-HYDROCHLOROTHIAZIDE 160-25 MG PO TABS: 1.0000 | ORAL_TABLET | Freq: Every day | ORAL | 1 refills | Status: DC

## 2016-01-02 NOTE — Progress Notes (Signed)
Name: Charles Valencia   MRN: [id number]    DOB: May 18, 1931   Date:01/02/2016       Progress Note  Subjective  Chief Complaint  Chief Complaint  Patient presents with  . Follow-up    3 mo  . Cerumen Impaction    Gastroesophageal Reflux  He reports no abdominal pain, no chest pain, no coughing, no dysphagia, no heartburn or no nausea. This is a chronic problem. The problem has been unchanged. The symptoms are aggravated by certain foods. He has tried a PPI for the symptoms. The treatment provided significant relief.  Hyperlipidemia  This is a chronic problem. Recent lipid tests were reviewed and are normal. Pertinent negatives include no chest pain, myalgias or shortness of breath. Current antihyperlipidemic treatment includes statins.  Hypertension  This is a chronic problem. The problem is unchanged. Pertinent negatives include no blurred vision, chest pain, headaches, palpitations or shortness of breath. Past treatments include angiotensin blockers and diuretics. There is no history of kidney disease, CAD/MI or CVA.    Past Medical History:  Diagnosis Date  . GERD (gastroesophageal reflux disease)   . Hyperlipidemia   . Hypertension   . Paroxysmal atrial fibrillation ([place])    Followed by Cardiology.    History reviewed. No pertinent surgical history.  History reviewed. No pertinent family history.  Social History   Social History  . Marital status: Single    Spouse name: N/A  . Number of children: N/A  . Years of education: N/A   Occupational History  . Not on file.   Social History Main Topics  . Smoking status: Former Research scientist (life sciences)  . Smokeless tobacco: Never Used  . Alcohol use No  . Drug use: No  . Sexual activity: No   Other Topics Concern  . Not on file   Social History Narrative  . No narrative on file     Current Outpatient Prescriptions:  .  ELIQUIS 2.5 MG TABS tablet, TAKE 1 TABLET BY MOUTH TWICE A DAY., Disp: 60 tablet, Rfl: 5 .  esomeprazole (NEXIUM)  40 MG capsule, Take 1 capsule (40 mg total) by mouth daily at 12 noon., Disp: 90 capsule, Rfl: 1 .  rosuvastatin (CRESTOR) 5 MG tablet, Take 1 tablet (5 mg total) by mouth daily., Disp: 90 tablet, Rfl: 1 .  valsartan-hydrochlorothiazide (DIOVAN-HCT) 160-25 MG tablet, Take 1 tablet by mouth daily., Disp: 90 tablet, Rfl: 1  No Known Allergies   Review of Systems  Eyes: Negative for blurred vision.  Respiratory: Negative for cough and shortness of breath.   Cardiovascular: Negative for chest pain and palpitations.  Gastrointestinal: Negative for abdominal pain, dysphagia, heartburn and nausea.  Musculoskeletal: Negative for myalgias.  Neurological: Negative for headaches.      Objective  Vitals:   01/02/16 1459  BP: 130/73  Pulse: 61  Resp: 15  Temp: 98.2 F (36.8 C)  TempSrc: Oral  SpO2: 98%  Weight: 217 lb (98.4 kg)  Height: 5\' 10"  (1.778 m)    Physical Exam  Constitutional: He is oriented to person, place, and time and well-developed, well-nourished, and in no distress.  Cardiovascular: Normal rate, S1 normal and S2 normal.  An irregular rhythm present.  Murmur heard.  Systolic murmur is present with a grade of 2/6  Pulmonary/Chest: Breath sounds normal. He has no wheezes. He has no rhonchi.  Abdominal: Soft. Bowel sounds are normal. There is no tenderness.  Musculoskeletal:       Right ankle: He exhibits swelling.  Left ankle: He exhibits swelling.  Neurological: He is alert and oriented to person, place, and time.  Psychiatric: Mood, memory, affect and judgment normal.  Nursing note and vitals reviewed.      Assessment & Plan  1. Essential hypertension  - valsartan-hydrochlorothiazide (DIOVAN-HCT) 160-25 MG tablet; Take 1 tablet by mouth daily.  Dispense: 90 tablet; Refill: 1  2. Gastroesophageal reflux disease, esophagitis presence not specified  - esomeprazole (NEXIUM) 40 MG capsule; Take 1 capsule (40 mg total) by mouth daily at 12 noon.  Dispense:  90 capsule; Refill: 1  3. Hyperlipidemia, unspecified hyperlipidemia type  - rosuvastatin (CRESTOR) 5 MG tablet; Take 1 tablet (5 mg total) by mouth daily.  Dispense: 90 tablet; Refill: 1    Hanah Moultry [facility] 01/02/2016 3:07 PM

## 2016-03-12 ENCOUNTER — Ambulatory Visit: Payer: Medicare Other | Admitting: Family Medicine

## 2016-05-01 ENCOUNTER — Encounter: Payer: Self-pay | Admitting: Family Medicine

## 2016-05-01 ENCOUNTER — Ambulatory Visit (INDEPENDENT_AMBULATORY_CARE_PROVIDER_SITE_OTHER): Payer: Medicare Other | Admitting: Family Medicine

## 2016-05-01 DIAGNOSIS — M79674 Pain in right toe(s): Secondary | ICD-10-CM

## 2016-05-01 DIAGNOSIS — M7989 Other specified soft tissue disorders: Secondary | ICD-10-CM

## 2016-05-01 DIAGNOSIS — B351 Tinea unguium: Secondary | ICD-10-CM | POA: Insufficient documentation

## 2016-05-01 LAB — CBC WITH DIFFERENTIAL/PLATELET
BASOS PCT: 0 %
Basophils Absolute: 0 cells/uL (ref 0–200)
EOS ABS: 232 {cells}/uL (ref 15–500)
Eosinophils Relative: 4 %
HEMATOCRIT: 46.3 % (ref 38.5–50.0)
HEMOGLOBIN: 15.2 g/dL (ref 13.2–17.1)
LYMPHS ABS: 986 {cells}/uL (ref 850–3900)
LYMPHS PCT: 17 %
MCH: 28.6 pg (ref 27.0–33.0)
MCHC: 32.8 g/dL (ref 32.0–36.0)
MCV: 87.2 fL (ref 80.0–100.0)
MONO ABS: 522 {cells}/uL (ref 200–950)
MPV: 11.1 fL (ref 7.5–12.5)
Monocytes Relative: 9 %
NEUTROS PCT: 70 %
Neutro Abs: 4060 cells/uL (ref 1500–7800)
Platelets: 191 10*3/uL (ref 140–400)
RBC: 5.31 MIL/uL (ref 4.20–5.80)
RDW: 13.8 % (ref 11.0–15.0)
WBC: 5.8 10*3/uL (ref 3.8–10.8)

## 2016-05-01 MED ORDER — TRAMADOL HCL 50 MG PO TABS: 50.0000 mg | ORAL_TABLET | Freq: Two times a day (BID) | ORAL | 0 refills | Status: AC | PRN

## 2016-05-01 NOTE — Progress Notes (Signed)
Name: Charles Valencia   MRN: [id number]    DOB: 16-Jul-1931   Date:05/01/2016       Progress Note  Subjective  Chief Complaint  Chief Complaint  Patient presents with  . Foot Pain    red, swollen for weeks    Foot Pain  This is a new problem. The current episode started 1 to 4 weeks ago (2 weeks ago). The problem occurs constantly. Pertinent negatives include no abdominal pain, chills, fatigue, fever or sore throat. He has tried nothing for the symptoms.     Past Medical History:  Diagnosis Date  . GERD (gastroesophageal reflux disease)   . Hyperlipidemia   . Hypertension   . Paroxysmal atrial fibrillation ([place])    Followed by Cardiology.    No past surgical history on file.  No family history on file.  Social History   Social History  . Marital status: Single    Spouse name: N/A  . Number of children: N/A  . Years of education: N/A   Occupational History  . Not on file.   Social History Main Topics  . Smoking status: Former Research scientist (life sciences)  . Smokeless tobacco: Never Used  . Alcohol use No  . Drug use: No  . Sexual activity: No   Other Topics Concern  . Not on file   Social History Narrative  . No narrative on file     Current Outpatient Prescriptions:  .  ELIQUIS 2.5 MG TABS tablet, TAKE 1 TABLET BY MOUTH TWICE A DAY., Disp: 60 tablet, Rfl: 5 .  esomeprazole (NEXIUM) 40 MG capsule, Take 1 capsule (40 mg total) by mouth daily at 12 noon., Disp: 90 capsule, Rfl: 1 .  rosuvastatin (CRESTOR) 5 MG tablet, Take 1 tablet (5 mg total) by mouth daily., Disp: 90 tablet, Rfl: 1 .  valsartan-hydrochlorothiazide (DIOVAN-HCT) 160-25 MG tablet, Take 1 tablet by mouth daily., Disp: 90 tablet, Rfl: 1  No Known Allergies   Review of Systems  Constitutional: Negative for chills, fatigue and fever.  HENT: Negative for sore throat.   Gastrointestinal: Negative for abdominal pain.      Objective  Vitals:   05/01/16 0939  BP: 110/70  Pulse: 79  Resp: 18  Temp: 98.3 F  (36.8 C)  TempSrc: Oral  SpO2: 98%  Weight: 214 lb 1.6 oz (97.1 kg)  Height: '5\' 10"'  (1.778 m)    Physical Exam  Constitutional: He is oriented to person, place, and time and well-developed, well-nourished, and in no distress.  Cardiovascular: Normal rate, regular rhythm, S1 normal, S2 normal and normal heart sounds.   No murmur heard. Pulmonary/Chest: Breath sounds normal. He has no wheezes. He has no rhonchi.  Musculoskeletal:       Right foot: There is tenderness and swelling.       Feet:  Area of erythema, tenderness and swelling on the medial side of right distal foot,  Neurological: He is alert and oriented to person, place, and time.  Psychiatric: Mood, memory, affect and judgment normal.  Nursing note and vitals reviewed.     Assessment & Plan  1. Pain and swelling of toe of right foot Differential includes gout versus cellulitis versus arthritis. Obtain pertinent lab work, started on tramadol for pain control as patient is not a candidate for NSAID therapy because of anticoagulation with Eliquis.  - CBC with Differential/Platelet - Sed Rate (ESR) - Uric acid - DG Foot Complete Right; Future - traMADol (ULTRAM) 50 MG tablet; Take 1 tablet (50 mg total)  by mouth every 12 (twelve) hours as needed.  Dispense: 10 tablet; Refill: 0   Charles Valencia [facility] 05/01/2016 9:48 AM

## 2016-05-02 LAB — URIC ACID: URIC ACID, SERUM: 8 mg/dL (ref 4.0–8.0)

## 2016-05-02 LAB — SEDIMENTATION RATE: Sed Rate: 4 mm/hr (ref 0–20)

## 2016-07-02 ENCOUNTER — Ambulatory Visit (INDEPENDENT_AMBULATORY_CARE_PROVIDER_SITE_OTHER): Payer: Medicare Other | Admitting: Family Medicine

## 2016-07-02 ENCOUNTER — Encounter: Payer: Self-pay | Admitting: Family Medicine

## 2016-07-02 VITALS — BP 112/76 | HR 71 | Temp 97.4°F | Resp 16 | Ht 70.0 in | Wt 208.5 lb

## 2016-07-02 DIAGNOSIS — R739 Hyperglycemia, unspecified: Secondary | ICD-10-CM

## 2016-07-02 DIAGNOSIS — I10 Essential (primary) hypertension: Secondary | ICD-10-CM

## 2016-07-02 DIAGNOSIS — E785 Hyperlipidemia, unspecified: Secondary | ICD-10-CM | POA: Diagnosis not present

## 2016-07-02 DIAGNOSIS — K219 Gastro-esophageal reflux disease without esophagitis: Secondary | ICD-10-CM | POA: Diagnosis not present

## 2016-07-02 LAB — POCT GLYCOSYLATED HEMOGLOBIN (HGB A1C): Hemoglobin A1C: 6.9

## 2016-07-02 MED ORDER — VALSARTAN-HYDROCHLOROTHIAZIDE 160-25 MG PO TABS: 1.0000 | ORAL_TABLET | Freq: Every day | ORAL | 1 refills | Status: DC

## 2016-07-02 MED ORDER — ROSUVASTATIN CALCIUM 5 MG PO TABS: 5.0000 mg | ORAL_TABLET | Freq: Every day | ORAL | 1 refills | Status: DC

## 2016-07-02 MED ORDER — ESOMEPRAZOLE MAGNESIUM 40 MG PO CPDR: 40.0000 mg | DELAYED_RELEASE_CAPSULE | Freq: Every day | ORAL | 1 refills | Status: DC

## 2016-07-02 NOTE — Progress Notes (Signed)
Name: Charles Valencia   MRN: [id number]    DOB: May 08, 1931   Date:07/02/2016       Progress Note  Subjective  Chief Complaint  Chief Complaint  Patient presents with  . Follow-up    6 mo    Gastroesophageal Reflux  He reports no abdominal pain, no chest pain, no coughing, no dysphagia, no heartburn or no nausea. This is a chronic problem. The problem has been unchanged. The symptoms are aggravated by certain foods. He has tried a PPI for the symptoms. The treatment provided significant relief.  Hyperlipidemia  This is a chronic problem. Recent lipid tests were reviewed and are normal. Pertinent negatives include no chest pain, myalgias or shortness of breath. Current antihyperlipidemic treatment includes statins.  Hypertension  This is a chronic problem. The problem is unchanged. Pertinent negatives include no blurred vision, chest pain, headaches, palpitations or shortness of breath. Past treatments include angiotensin blockers and diuretics. There is no history of kidney disease, CAD/MI or CVA.      Past Medical History:  Diagnosis Date  . GERD (gastroesophageal reflux disease)   . Hyperlipidemia   . Hypertension   . Paroxysmal atrial fibrillation ([place])    Followed by Cardiology.    History reviewed. No pertinent surgical history.  History reviewed. No pertinent family history.  Social History   Social History  . Marital status: Single    Spouse name: N/A  . Number of children: N/A  . Years of education: N/A   Occupational History  . Not on file.   Social History Main Topics  . Smoking status: Former Research scientist (life sciences)  . Smokeless tobacco: Never Used  . Alcohol use No  . Drug use: No  . Sexual activity: No   Other Topics Concern  . Not on file   Social History Narrative  . No narrative on file     Current Outpatient Prescriptions:  .  ELIQUIS 2.5 MG TABS tablet, TAKE 1 TABLET BY MOUTH TWICE A DAY., Disp: 60 tablet, Rfl: 5 .  esomeprazole (NEXIUM) 40 MG capsule, Take  1 capsule (40 mg total) by mouth daily at 12 noon., Disp: 90 capsule, Rfl: 1 .  rosuvastatin (CRESTOR) 5 MG tablet, Take 1 tablet (5 mg total) by mouth daily., Disp: 90 tablet, Rfl: 1 .  valsartan-hydrochlorothiazide (DIOVAN-HCT) 160-25 MG tablet, Take 1 tablet by mouth daily., Disp: 90 tablet, Rfl: 1  No Known Allergies   Review of Systems  Eyes: Negative for blurred vision.  Respiratory: Negative for cough and shortness of breath.   Cardiovascular: Negative for chest pain and palpitations.  Gastrointestinal: Negative for abdominal pain, dysphagia, heartburn and nausea.  Musculoskeletal: Negative for myalgias.  Neurological: Negative for headaches.      Objective  Vitals:   07/02/16 1325  BP: 112/76  Pulse: 71  Resp: 16  Temp: 97.4 F (36.3 C)  TempSrc: Oral  SpO2: 96%  Weight: 208 lb 8 oz (94.6 kg)  Height: 5\' 10"  (1.778 m)    Physical Exam  Constitutional: He is oriented to person, place, and time and well-developed, well-nourished, and in no distress.  HENT:  Head: Normocephalic and atraumatic.  Cardiovascular: Normal rate and normal heart sounds.  A regularly irregular rhythm present.  No murmur heard. Pulmonary/Chest: Effort normal and breath sounds normal. He has no wheezes.  Neurological: He is alert and oriented to person, place, and time.  Psychiatric: Mood, memory, affect and judgment normal.  Nursing note and vitals reviewed.    Assessment & Plan  1. Essential hypertension BP stable on present antihypertensive therapy - valsartan-hydrochlorothiazide (DIOVAN-HCT) 160-25 MG tablet; Take 1 tablet by mouth daily.  Dispense: 90 tablet; Refill: 1  2. Gastroesophageal reflux disease, esophagitis presence not specified  - esomeprazole (NEXIUM) 40 MG capsule; Take 1 capsule (40 mg total) by mouth daily at 12 noon.  Dispense: 90 capsule; Refill: 1  3. Hyperlipidemia, unspecified hyperlipidemia type FLP at goal, continue low-dose statin - rosuvastatin  (CRESTOR) 5 MG tablet; Take 1 tablet (5 mg total) by mouth daily.  Dispense: 90 tablet; Refill: 1 - Lipid panel - COMPLETE METABOLIC PANEL WITH GFR  4. Hyperglycemia Point of care A1c 6.9%, well-controlled diabetes, no indication for pharmacotherapy at this point - POCT glycosylated hemoglobin (Hb A1C)  Zeb Rawl [facility] 07/02/2016 1:33 PM

## 2016-07-03 ENCOUNTER — Ambulatory Visit: Payer: Medicare Other | Admitting: Family Medicine

## 2016-07-30 ENCOUNTER — Other Ambulatory Visit: Payer: Self-pay | Admitting: Family Medicine

## 2016-07-30 LAB — LIPID PANEL
CHOL/HDL RATIO: 2.7 ratio (ref <5.0)
CHOLESTEROL: 111 mg/dL (ref <200)
HDL: 41 mg/dL (ref >40)
LDL Cholesterol: 47 mg/dL (ref <100)
TRIGLYCERIDES: 115 mg/dL (ref <150)
VLDL: 23 mg/dL (ref <30)

## 2016-07-30 LAB — COMPLETE METABOLIC PANEL WITH GFR
ALBUMIN: 4.2 g/dL (ref 3.6–5.1)
ALK PHOS: 73 U/L (ref 40–115)
ALT: 19 U/L (ref 9–46)
AST: 26 U/L (ref 10–35)
BILIRUBIN TOTAL: 1 mg/dL (ref 0.2–1.2)
BUN: 17 mg/dL (ref 7–25)
CALCIUM: 9.4 mg/dL (ref 8.6–10.3)
CO2: 26 mmol/L (ref 20–31)
Chloride: 102 mmol/L (ref 98–110)
Creat: 1.24 mg/dL — ABNORMAL HIGH (ref 0.70–1.11)
GFR, Est African American: 62 mL/min (ref >=60)
GFR, Est Non African American: 53 mL/min — ABNORMAL LOW (ref >=60)
GLUCOSE: 104 mg/dL — AB (ref 65–99)
POTASSIUM: 3.4 mmol/L — AB (ref 3.5–5.3)
SODIUM: 140 mmol/L (ref 135–146)
TOTAL PROTEIN: 7.1 g/dL (ref 6.1–8.1)

## 2016-08-08 ENCOUNTER — Other Ambulatory Visit: Payer: Self-pay | Admitting: Family Medicine

## 2016-08-12 ENCOUNTER — Telehealth: Payer: Self-pay | Admitting: Family Medicine

## 2016-08-12 ENCOUNTER — Encounter: Payer: Medicare Other | Admitting: Family Medicine

## 2016-08-12 NOTE — Telephone Encounter (Signed)
Pt is currently taking valsartan however there is a recall on the medication. Asking that you please prescribe something different. [facility]

## 2016-08-14 ENCOUNTER — Other Ambulatory Visit: Payer: Self-pay | Admitting: Family Medicine

## 2016-08-14 MED ORDER — LOSARTAN POTASSIUM-HCTZ 100-12.5 MG PO TABS: 1.0000 | ORAL_TABLET | Freq: Every day | ORAL | 1 refills | Status: DC

## 2016-08-14 NOTE — Telephone Encounter (Signed)
Changed to Losartan/hctz 100/12.5

## 2016-08-14 NOTE — Telephone Encounter (Signed)
Dr. Ancil Boozer, pt is currently taking Valsartan and there is a recall on this medication and he would like to know if it can please be changed he has been notified that Dr. Manuella Ghazi is currently out of the office this week.

## 2016-08-15 NOTE — Telephone Encounter (Signed)
Patient has been notified

## 2016-08-27 ENCOUNTER — Encounter: Payer: Medicare Other | Admitting: Family Medicine

## 2016-09-05 ENCOUNTER — Encounter: Payer: Medicare Other | Admitting: Family Medicine

## 2016-09-10 ENCOUNTER — Ambulatory Visit (INDEPENDENT_AMBULATORY_CARE_PROVIDER_SITE_OTHER): Payer: Medicare Other | Admitting: Family Medicine

## 2016-09-10 ENCOUNTER — Encounter: Payer: Self-pay | Admitting: Family Medicine

## 2016-09-10 VITALS — BP 115/78 | HR 60 | Temp 97.7°F | Resp 16 | Ht 70.0 in | Wt 211.0 lb

## 2016-09-10 DIAGNOSIS — Z125 Encounter for screening for malignant neoplasm of prostate: Secondary | ICD-10-CM | POA: Diagnosis not present

## 2016-09-10 DIAGNOSIS — Z0000 Encounter for general adult medical examination without abnormal findings: Secondary | ICD-10-CM | POA: Diagnosis not present

## 2016-09-10 DIAGNOSIS — L989 Disorder of the skin and subcutaneous tissue, unspecified: Secondary | ICD-10-CM | POA: Diagnosis not present

## 2016-09-10 DIAGNOSIS — Z23 Encounter for immunization: Secondary | ICD-10-CM

## 2016-09-10 DIAGNOSIS — E876 Hypokalemia: Secondary | ICD-10-CM

## 2016-09-10 DIAGNOSIS — Z1211 Encounter for screening for malignant neoplasm of colon: Secondary | ICD-10-CM | POA: Diagnosis not present

## 2016-09-10 NOTE — Progress Notes (Signed)
Name: Charles Valencia   MRN: [id number]    DOB: [birth year]   Date:09/10/2016       Progress Note  Subjective  Chief Complaint  Chief Complaint  Patient presents with  . Annual Exam    CPE    HPI  Pt. Presents for complete physical exam.  He is due for colon cancer screening, will order Cologuard. He is due for prostate cancer screening, will order PSA.  He is due for high-risk flu and pneumonia vaccines   Past Medical History:  Diagnosis Date  . GERD (gastroesophageal reflux disease)   . Hyperlipidemia   . Hypertension   . Paroxysmal atrial fibrillation ([place])    Followed by Cardiology.    History reviewed. No pertinent surgical history.  History reviewed. No pertinent family history.  Social History   Social History  . Marital status: Single    Spouse name: N/A  . Number of children: N/A  . Years of education: N/A   Occupational History  . Not on file.   Social History Main Topics  . Smoking status: Former Research scientist (life sciences)  . Smokeless tobacco: Never Used  . Alcohol use No  . Drug use: No  . Sexual activity: No   Other Topics Concern  . Not on file   Social History Narrative  . No narrative on file     Current Outpatient Prescriptions:  .  ELIQUIS 2.5 MG TABS tablet, TAKE 1 TABLET BY MOUTH TWICE A DAY., Disp: 90 tablet, Rfl: 0 .  esomeprazole (NEXIUM) 40 MG capsule, Take 1 capsule (40 mg total) by mouth daily at 12 noon., Disp: 90 capsule, Rfl: 1 .  losartan-hydrochlorothiazide (HYZAAR) 100-12.5 MG tablet, Take 1 tablet by mouth daily., Disp: 90 tablet, Rfl: 1 .  rosuvastatin (CRESTOR) 5 MG tablet, Take 1 tablet (5 mg total) by mouth daily., Disp: 90 tablet, Rfl: 1 .  valsartan-hydrochlorothiazide (DIOVAN-HCT) 160-25 MG tablet, , Disp: , Rfl:   No Known Allergies   Review of Systems  Constitutional: Negative for chills, fever, malaise/fatigue and weight loss.  HENT: Negative for congestion, ear pain and sore throat.   Eyes: Negative for blurred vision and  double vision.  Respiratory: Negative for cough, sputum production and shortness of breath.   Cardiovascular: Negative for chest pain, palpitations and leg swelling.  Gastrointestinal: Negative for abdominal pain, blood in stool, nausea and vomiting.  Genitourinary: Negative for dysuria and hematuria.  Musculoskeletal: Negative for back pain and neck pain.  Neurological: Negative for dizziness and headaches.  Psychiatric/Behavioral: Negative for depression. The patient is not nervous/anxious and does not have insomnia.       Objective  Vitals:   09/10/16 1056  BP: 115/78  Pulse: 60  Resp: 16  Temp: 97.7 F (36.5 C)  TempSrc: Oral  SpO2: 98%  Weight: 211 lb (95.7 kg)  Height: 5\' 10"  (1.778 m)    Physical Exam  Constitutional: He is oriented to person, place, and time and well-developed, well-nourished, and in no distress.  HENT:  Head: Normocephalic and atraumatic.  Cardiovascular: Normal rate, regular rhythm and normal heart sounds.   No murmur heard. Pulmonary/Chest: Effort normal and breath sounds normal. He has no wheezes.  Abdominal: Soft. Bowel sounds are normal. There is no tenderness.    Macular hyperpigmented non pruritic non-draining lesions on the left lower abdomen  Genitourinary: Prostate normal. Rectal exam shows mass.  Musculoskeletal: Normal range of motion. He exhibits edema.       Right ankle: He exhibits swelling.  Left ankle: He exhibits swelling.  Neurological: He is alert and oriented to person, place, and time.  Psychiatric: Mood, memory, affect and judgment normal.  Nursing note and vitals reviewed.       Assessment & Plan  1. Annual physical exam Obtain age-appropriate laboratory screening - TSH - VITAMIN D 25 Hydroxy (Vit-D Deficiency, Fractures)  2. Screening for colon cancer  - Cologuard  3. Screening for prostate cancer  - PSA  4. Hypokalemia Obtain serum potassium levels - BASIC METABOLIC PANEL WITH GFR  5. Skin  lesion Suspect chronic irritation, unlikely to be shingles, referral to dermatology - Ambulatory referral to Dermatology  6. Need for influenza vaccination  - Flu vaccine HIGH DOSE PF (Fluzone High Dose)  7. Need for pneumococcal vaccination  - Pneumococcal polysaccharide vaccine 23-valent greater than or equal to 2yo subcutaneous/IM  [employer] 09/10/2016 11:07 AM

## 2016-09-11 LAB — BASIC METABOLIC PANEL WITH GFR
BUN: 12 mg/dL (ref 7–25)
CALCIUM: 9.8 mg/dL (ref 8.6–10.3)
CHLORIDE: 96 mmol/L — AB (ref 98–110)
CO2: 32 mmol/L (ref 20–32)
Creat: 1 mg/dL (ref 0.70–1.11)
GFR, EST AFRICAN AMERICAN: 80 mL/min/{1.73_m2} (ref >=60)
GFR, Est Non African American: 69 mL/min/{1.73_m2} (ref >=60)
Glucose, Bld: 99 mg/dL (ref 65–99)
POTASSIUM: 2.8 mmol/L — AB (ref 3.5–5.3)
Sodium: 145 mmol/L (ref 135–146)

## 2016-09-11 LAB — PSA: PSA: 16.9 ng/mL — ABNORMAL HIGH (ref <=4.0)

## 2016-09-11 LAB — TSH: TSH: 3.56 mIU/L (ref 0.40–4.50)

## 2016-09-11 LAB — VITAMIN D 25 HYDROXY (VIT D DEFICIENCY, FRACTURES): VIT D 25 HYDROXY: 19 ng/mL — AB (ref 30–100)

## 2016-09-12 ENCOUNTER — Other Ambulatory Visit: Payer: Self-pay | Admitting: Family Medicine

## 2016-09-12 DIAGNOSIS — R972 Elevated prostate specific antigen [PSA]: Secondary | ICD-10-CM

## 2016-09-12 DIAGNOSIS — E876 Hypokalemia: Secondary | ICD-10-CM

## 2016-09-12 DIAGNOSIS — C61 Malignant neoplasm of prostate: Secondary | ICD-10-CM | POA: Insufficient documentation

## 2016-09-12 MED ORDER — POTASSIUM CHLORIDE ER 10 MEQ PO TBCR: 10.0000 meq | EXTENDED_RELEASE_TABLET | Freq: Every day | ORAL | 0 refills | Status: DC

## 2016-09-16 ENCOUNTER — Ambulatory Visit (INDEPENDENT_AMBULATORY_CARE_PROVIDER_SITE_OTHER): Payer: Medicare Other | Admitting: Family Medicine

## 2016-09-16 ENCOUNTER — Telehealth: Payer: Self-pay

## 2016-09-16 ENCOUNTER — Encounter: Payer: Self-pay | Admitting: Family Medicine

## 2016-09-16 VITALS — BP 138/72 | HR 60 | Temp 97.4°F | Resp 16 | Ht 70.0 in | Wt 214.0 lb

## 2016-09-16 DIAGNOSIS — E876 Hypokalemia: Secondary | ICD-10-CM | POA: Diagnosis not present

## 2016-09-16 MED ORDER — VITAMIN D (ERGOCALCIFEROL) 1.25 MG (50000 UNIT) PO CAPS: 50000.0000 [IU] | ORAL_CAPSULE | ORAL | 0 refills | Status: DC

## 2016-09-16 NOTE — Progress Notes (Signed)
Name: Charles Valencia   MRN: [id number]    DOB: Nov 26, 1931   Date:09/16/2016       Progress Note  Subjective  Chief Complaint  Chief Complaint  Patient presents with  . Follow-up    HPI  Patient presents for repeat testing of potassium levels, initial value obtained on 09/10/16 was below normal at 2.8, he was started on Potassium chloride 10 mEq daily, denies any chest pain, palpitations, dizziness or weakness.    Past Medical History:  Diagnosis Date  . GERD (gastroesophageal reflux disease)   . Hyperlipidemia   . Hypertension   . Paroxysmal atrial fibrillation ([place])    Followed by Cardiology.    No past surgical history on file.  No family history on file.  Social History   Social History  . Marital status: Single    Spouse name: N/A  . Number of children: N/A  . Years of education: N/A   Occupational History  . Not on file.   Social History Main Topics  . Smoking status: Former Research scientist (life sciences)  . Smokeless tobacco: Never Used  . Alcohol use No  . Drug use: No  . Sexual activity: No   Other Topics Concern  . Not on file   Social History Narrative  . No narrative on file     Current Outpatient Prescriptions:  .  aspirin 81 MG tablet, Take 81 mg by mouth daily., Disp: , Rfl:  .  ELIQUIS 2.5 MG TABS tablet, TAKE 1 TABLET BY MOUTH TWICE A DAY., Disp: 90 tablet, Rfl: 0 .  esomeprazole (NEXIUM) 40 MG capsule, Take 1 capsule (40 mg total) by mouth daily at 12 noon., Disp: 90 capsule, Rfl: 1 .  losartan-hydrochlorothiazide (HYZAAR) 100-12.5 MG tablet, Take 1 tablet by mouth daily., Disp: 90 tablet, Rfl: 1 .  potassium chloride (K-DUR) 10 MEQ tablet, Take 1 tablet (10 mEq total) by mouth daily., Disp: 10 tablet, Rfl: 0 .  rosuvastatin (CRESTOR) 5 MG tablet, Take 1 tablet (5 mg total) by mouth daily., Disp: 90 tablet, Rfl: 1 .  traMADol (ULTRAM) 50 MG tablet, Take by mouth every 12 (twelve) hours as needed., Disp: , Rfl:  .  valsartan-hydrochlorothiazide (DIOVAN-HCT) 160-25  MG tablet, , Disp: , Rfl:   No Known Allergies   ROS  Please see history of present illness for complete discussion of ROS  Objective  Vitals:   09/16/16 1322  BP: 138/72  Pulse: 60  Resp: 16  Temp: (!) 97.4 F (36.3 C)  TempSrc: Oral  SpO2: 98%  Weight: 214 lb (97.1 kg)  Height: 5\' 10"  (1.778 m)    Physical Exam  Constitutional: He is oriented to person, place, and time and well-developed, well-nourished, and in no distress.  HENT:  Head: Normocephalic and atraumatic.  Cardiovascular: Normal rate, regular rhythm and normal heart sounds.   No murmur heard. Pulmonary/Chest: Effort normal and breath sounds normal. He has no wheezes.  Neurological: He is alert and oriented to person, place, and time.  Nursing note and vitals reviewed.         Assessment & Plan  1. Hypokalemia due to loss of potassium Obtain potassium levels, hypokalemia is likely diuretic induced and may need long-term potassium supplementation. - BASIC METABOLIC PANEL WITH GFR   Keimya Briddell [facility] 09/16/2016 1:32 PM

## 2016-09-16 NOTE — Telephone Encounter (Signed)
Patient has been notified of lab results and a prescription for vitamin D3 50,000 units take 1 capsule once a week x12 weeks has been sent to [facility] per Dr. Manuella Ghazi, patient verbalized understanding

## 2016-09-17 ENCOUNTER — Other Ambulatory Visit: Payer: Self-pay | Admitting: Family Medicine

## 2016-09-17 DIAGNOSIS — E876 Hypokalemia: Secondary | ICD-10-CM

## 2016-09-17 LAB — BASIC METABOLIC PANEL WITH GFR
BUN: 14 mg/dL (ref 7–25)
CHLORIDE: 100 mmol/L (ref 98–110)
CO2: 33 mmol/L — AB (ref 20–32)
Calcium: 8.8 mg/dL (ref 8.6–10.3)
Creat: 0.97 mg/dL (ref 0.70–1.11)
GFR, EST NON AFRICAN AMERICAN: 72 mL/min/{1.73_m2} (ref >=60)
GFR, Est African American: 83 mL/min/{1.73_m2} (ref >=60)
GLUCOSE: 104 mg/dL (ref 65–139)
POTASSIUM: 2.8 mmol/L — AB (ref 3.5–5.3)
Sodium: 141 mmol/L (ref 135–146)

## 2016-09-17 MED ORDER — POTASSIUM CHLORIDE ER 20 MEQ PO TBCR: 20.0000 meq | EXTENDED_RELEASE_TABLET | Freq: Every day | ORAL | 0 refills | Status: DC

## 2016-09-23 ENCOUNTER — Other Ambulatory Visit: Payer: Self-pay

## 2016-09-23 DIAGNOSIS — R899 Unspecified abnormal finding in specimens from other organs, systems and tissues: Secondary | ICD-10-CM

## 2016-09-23 NOTE — Progress Notes (Signed)
BMP has been reordered due to low potassium per Dr. Manuella Ghazi

## 2016-09-24 LAB — BASIC METABOLIC PANEL WITH GFR
BUN/Creatinine Ratio: 13 (calc) (ref 6–22)
BUN: 15 mg/dL (ref 7–25)
CALCIUM: 9.3 mg/dL (ref 8.6–10.3)
CHLORIDE: 102 mmol/L (ref 98–110)
CO2: 29 mmol/L (ref 20–32)
Creat: 1.12 mg/dL — ABNORMAL HIGH (ref 0.70–1.11)
GFR, EST AFRICAN AMERICAN: 70 mL/min/{1.73_m2} (ref >=60)
GFR, Est Non African American: 60 mL/min/{1.73_m2} (ref >=60)
GLUCOSE: 106 mg/dL — AB (ref 65–99)
POTASSIUM: 3.1 mmol/L — AB (ref 3.5–5.3)
Sodium: 143 mmol/L (ref 135–146)

## 2016-09-26 ENCOUNTER — Other Ambulatory Visit: Payer: Self-pay | Admitting: Family Medicine

## 2016-09-26 DIAGNOSIS — E876 Hypokalemia: Secondary | ICD-10-CM

## 2016-09-26 MED ORDER — POTASSIUM CHLORIDE ER 10 MEQ PO TBCR: 10.0000 meq | EXTENDED_RELEASE_TABLET | Freq: Every day | ORAL | 2 refills | Status: DC

## 2016-09-29 ENCOUNTER — Ambulatory Visit (INDEPENDENT_AMBULATORY_CARE_PROVIDER_SITE_OTHER): Payer: Medicare Other | Admitting: Family Medicine

## 2016-09-29 VITALS — BP 128/72 | HR 69 | Temp 98.6°F | Resp 16 | Ht 70.0 in | Wt 209.2 lb

## 2016-09-29 DIAGNOSIS — H938X3 Other specified disorders of ear, bilateral: Secondary | ICD-10-CM | POA: Diagnosis not present

## 2016-09-29 NOTE — Progress Notes (Signed)
Name: Charles Valencia   MRN: [id number]    DOB: March 29, 1931   Date:09/29/2016       Progress Note  Subjective  Chief Complaint  Chief Complaint  Patient presents with  . Ear Fullness    pt stated that he has had some difficulty hearing    Ear Fullness   There is pain in both ears. This is a new problem. The current episode started more than 1 month ago. There has been no fever. The patient is experiencing no pain (has occasional discomfort in left ear). Pertinent negatives include no coughing, ear discharge or rash. He has tried nothing for the symptoms.     Past Medical History:  Diagnosis Date  . GERD (gastroesophageal reflux disease)   . Hyperlipidemia   . Hypertension   . Paroxysmal atrial fibrillation ([place])    Followed by Cardiology.    No past surgical history on file.  No family history on file.  Social History   Social History  . Marital status: Single    Spouse name: N/A  . Number of children: N/A  . Years of education: N/A   Occupational History  . Not on file.   Social History Main Topics  . Smoking status: Former Research scientist (life sciences)  . Smokeless tobacco: Never Used  . Alcohol use No  . Drug use: No  . Sexual activity: No   Other Topics Concern  . Not on file   Social History Narrative  . No narrative on file     Current Outpatient Prescriptions:  .  aspirin 81 MG tablet, Take 81 mg by mouth daily., Disp: , Rfl:  .  ELIQUIS 2.5 MG TABS tablet, TAKE 1 TABLET BY MOUTH TWICE A DAY., Disp: 90 tablet, Rfl: 0 .  esomeprazole (NEXIUM) 40 MG capsule, Take 1 capsule (40 mg total) by mouth daily at 12 noon., Disp: 90 capsule, Rfl: 1 .  losartan-hydrochlorothiazide (HYZAAR) 100-12.5 MG tablet, Take 1 tablet by mouth daily., Disp: 90 tablet, Rfl: 1 .  potassium chloride (K-DUR) 10 MEQ tablet, Take 1 tablet (10 mEq total) by mouth daily., Disp: 30 tablet, Rfl: 2 .  rosuvastatin (CRESTOR) 5 MG tablet, Take 1 tablet (5 mg total) by mouth daily., Disp: 90 tablet, Rfl: 1 .   traMADol (ULTRAM) 50 MG tablet, Take by mouth every 12 (twelve) hours as needed., Disp: , Rfl:  .  Vitamin D, Ergocalciferol, (DRISDOL) 50000 units CAPS capsule, Take 1 capsule (50,000 Units total) by mouth once a week. For 12 weeks, Disp: 12 capsule, Rfl: 0  No Known Allergies   Review of Systems  HENT: Negative for ear discharge.   Respiratory: Negative for cough.   Skin: Negative for rash.     Objective  Vitals:   09/29/16 1023  BP: 128/72  Pulse: 69  Resp: 16  Temp: 98.6 F (37 C)  SpO2: 98%  Weight: 209 lb 4 oz (94.9 kg)  Height: 5\' 10"  (1.778 m)    Physical Exam  Constitutional: He is oriented to person, place, and time and well-developed, well-nourished, and in no distress.  HENT:  Head: Normocephalic and atraumatic.  Right Ear: Tympanic membrane and ear canal normal. There is drainage.  Left Ear: Tympanic membrane and ear canal normal. There is drainage.  Ears:  Wax build up in the antitragus area  Cardiovascular: Normal rate, regular rhythm and normal heart sounds.   Pulmonary/Chest: Effort normal and breath sounds normal. He has no wheezes.  Neurological: He is alert and oriented to person,  place, and time.  Psychiatric: Mood, memory, affect and judgment normal.  Nursing note and vitals reviewed.      Assessment & Plan  1. Sensation of fullness in ear, bilateral Ear lavage completed - Ear Lavage    Winfield Caba [facility] 09/29/2016 10:50 AM

## 2016-10-07 ENCOUNTER — Ambulatory Visit (INDEPENDENT_AMBULATORY_CARE_PROVIDER_SITE_OTHER): Payer: Medicare Other | Admitting: Urology

## 2016-10-07 ENCOUNTER — Encounter: Payer: Self-pay | Admitting: Urology

## 2016-10-07 VITALS — BP 134/69 | HR 73 | Ht 72.0 in | Wt 209.0 lb

## 2016-10-07 DIAGNOSIS — R972 Elevated prostate specific antigen [PSA]: Secondary | ICD-10-CM | POA: Diagnosis not present

## 2016-10-07 DIAGNOSIS — R351 Nocturia: Secondary | ICD-10-CM

## 2016-10-07 MED ORDER — TAMSULOSIN HCL 0.4 MG PO CAPS: 0.4000 mg | ORAL_CAPSULE | Freq: Every day | ORAL | 11 refills | Status: DC

## 2016-10-07 NOTE — Progress Notes (Signed)
10/07/2016 5:11 PM   [name] 1931/07/08 [id number]  Referring provider: Roselee Nova, MD [address] [place],  [address]  Chief Complaint  Patient presents with  . Elevated PSA    New patient    HPI: 83 year old male who presents today for further evaluation of elevated PSA. PSA was checked by his PCP on 09/10/2016 and markedly elevated to 16.9. As far as he is aware, he still had a workup for elevated PSA in the past.  Last known PSA in 2012 8.4.  He does get up 5-6x nightly to void which is longstanding.  He denies daytime urgency or frequency.  Good flow and feels like he is able to empty this bladder.  No dysuria or gross hematuria. He is not currently on any BPH medications.  No history of OSA.  He does have DM but this is diet controlled.    No family history of prostate cancer although he thinks his brother has cancer but not sure which kind.  No weight loss or back pain.    CT scan in the remote past (report only) 2006 to shows a large pelvic mass measuring 6.9 x 4.4 cm located between the rectum and the bladder with an impression upon the base of the bladder which may represent prostatic hypertrophy. Is unclear whether this is her followed up or if he had further evaluation.   PMH: Past Medical History:  Diagnosis Date  . Diabetes ([place])   . GERD (gastroesophageal reflux disease)   . Glaucoma   . Hyperlipidemia   . Hypertension   . Paroxysmal atrial fibrillation ([place])    Followed by Cardiology.    Surgical History: No past surgical history on file.  Home Medications:  Allergies as of 10/07/2016   No Known Allergies     Medication List       Accurate as of 10/07/16 11:59 PM. Always use your most recent med list.          aspirin 81 MG tablet Take 81 mg by mouth daily.   ELIQUIS 2.5 MG Tabs tablet Generic drug:  apixaban TAKE 1 TABLET BY MOUTH TWICE A DAY.   esomeprazole 40 MG capsule Commonly known as:   NEXIUM Take 1 capsule (40 mg total) by mouth daily at 12 noon.   losartan-hydrochlorothiazide 100-12.5 MG tablet Commonly known as:  HYZAAR Take 1 tablet by mouth daily.   potassium chloride 10 MEQ tablet Commonly known as:  K-DUR Take 1 tablet (10 mEq total) by mouth daily.   rosuvastatin 5 MG tablet Commonly known as:  CRESTOR Take 1 tablet (5 mg total) by mouth daily.   tamsulosin 0.4 MG Caps capsule Commonly known as:  FLOMAX Take 1 capsule (0.4 mg total) by mouth daily.   traMADol 50 MG tablet Commonly known as:  ULTRAM Take by mouth every 12 (twelve) hours as needed.   Vitamin D (Ergocalciferol) 50000 units Caps capsule Commonly known as:  DRISDOL Take 1 capsule (50,000 Units total) by mouth once a week. For 12 weeks       Allergies: No Known Allergies  Family History: Family History  Problem Relation Age of Onset  . Bladder Cancer Neg Hx   . Kidney cancer Neg Hx   . AAA (abdominal aortic aneurysm) Neg Hx     Social History:  reports that he has quit smoking. He has never used smokeless tobacco. He reports that he does not drink alcohol or use drugs.  ROS: UROLOGY  Frequent Urination?: No Hard to postpone urination?: No Burning/pain with urination?: No Get up at night to urinate?: Yes Leakage of urine?: No Urine stream starts and stops?: No Trouble starting stream?: No Do you have to strain to urinate?: No Blood in urine?: No Urinary tract infection?: No Sexually transmitted disease?: No Injury to kidneys or bladder?: No Painful intercourse?: No Weak stream?: No Erection problems?: Yes Penile pain?: No  Gastrointestinal Nausea?: No Vomiting?: No Indigestion/heartburn?: No Diarrhea?: No Constipation?: No  Constitutional Fever: No Night sweats?: No Weight loss?: No Fatigue?: No  Skin Skin rash/lesions?: Yes Itching?: Yes  Eyes Blurred vision?: No Double vision?: No  Ears/Nose/Throat Sore throat?: No Sinus problems?:  No  Hematologic/Lymphatic Swollen glands?: No Easy bruising?: No  Cardiovascular Leg swelling?: No Chest pain?: No  Respiratory Cough?: No Shortness of breath?: No  Endocrine Excessive thirst?: No  Musculoskeletal Back pain?: No Joint pain?: No  Neurological Headaches?: No Dizziness?: No  Psychologic Depression?: No Anxiety?: No  Physical Exam: BP 134/69   Pulse 73   Ht 6' (1.829 m)   Wt 209 lb (94.8 kg)   BMI 28.35 kg/m   Constitutional:  Alert and oriented, No acute distress. HEENT: [place] AT, moist mucus membranes.  Trachea midline, no masses. Cardiovascular: No clubbing, cyanosis, or edema. Respiratory: Normal respiratory effort, no increased work of breathing. GI: Abdomen is soft, nontender, nondistended, no abdominal masses GU: No CVA tenderness.  Rectal: Nornal sphincter tone.  Enlarged gland, massive and difficulty to appreciate base.  Right lateral induration along ridge without discrete nodule but difficult exam due to prostate size and habitus.   Skin: No rashes, bruises or suspicious lesions. Lymph: No cervical or inguinal adenopathy. Neurologic: Grossly intact, no focal deficits, moving all 4 extremities. Psychiatric: Normal mood and affect.  Laboratory Data: Lab Results  Component Value Date   WBC 5.8 05/01/2016   HGB 15.2 05/01/2016   HCT 46.3 05/01/2016   MCV 87.2 05/01/2016   PLT 191 05/01/2016    Lab Results  Component Value Date   CREATININE 1.12 (H) 09/23/2016     Lab Results  Component Value Date   HGBA1C 6.9 07/02/2016    Urinalysis N/A  Pertinent Imaging: 2006 report as above  Assessment & Plan:    1. Elevated PSA  We reviewed the implications of an elevated PSA and the uncertainty surrounding it. In general, a man's PSA increases with age and is produced by both normal and cancerous prostate tissue. Differential for elevated PSA is BPH, prostate cancer, infection, recent intercourse/ejaculation, prostate infarction,  recent urethroscopic manipulation (foley placement/cystoscopy) and prostatitis. Management of an elevated PSA can include observation or prostate biopsy and we discussed this in detail.  In general, PSA screening is deferred at this age. We discussed the rationale for this today.  PSA has doubled over the past 6 years which is relatively slow doubling time.  He does have some asymmetry of his prostate exam and certainly could have some underlying prostate cancer, however, is not an optimal candidate for treatment of localized prostate cancer should it be identified and benefits/risk would need to be carefully weighed.  As such, plan to repeat his PSA in 3 months to assess for trend.  If PSA continues to rise, we may offer prostate biopsy but continue to discuss this in detail.  He understands the importance of close follow-up.   2. BPH with nocturia Significantly enlarged gland on exam today, also indications based on previous imaging that his prostate is significantly  enlarged Discussed behavioral modification for nocturia Trial of Flomax, side effects reviewed   Return in about 3 months (around 01/07/2017) for PSA (few days before), PVR.  [name], MD  [facility] [address] [place], [place] [address] [phone]

## 2016-10-08 ENCOUNTER — Other Ambulatory Visit: Payer: Self-pay | Admitting: Family Medicine

## 2016-11-12 DIAGNOSIS — I495 Sick sinus syndrome: Secondary | ICD-10-CM | POA: Diagnosis not present

## 2016-11-12 DIAGNOSIS — I480 Paroxysmal atrial fibrillation: Secondary | ICD-10-CM | POA: Diagnosis not present

## 2016-11-12 DIAGNOSIS — R0602 Shortness of breath: Secondary | ICD-10-CM | POA: Diagnosis not present

## 2016-11-12 DIAGNOSIS — R001 Bradycardia, unspecified: Secondary | ICD-10-CM | POA: Diagnosis not present

## 2016-11-12 DIAGNOSIS — I10 Essential (primary) hypertension: Secondary | ICD-10-CM | POA: Diagnosis not present

## 2016-12-10 ENCOUNTER — Ambulatory Visit (INDEPENDENT_AMBULATORY_CARE_PROVIDER_SITE_OTHER): Payer: Medicare Other | Admitting: Family Medicine

## 2016-12-10 ENCOUNTER — Encounter: Payer: Self-pay | Admitting: Family Medicine

## 2016-12-10 VITALS — BP 132/68 | HR 63 | Temp 97.5°F | Resp 16 | Wt 210.8 lb

## 2016-12-10 DIAGNOSIS — K219 Gastro-esophageal reflux disease without esophagitis: Secondary | ICD-10-CM | POA: Diagnosis not present

## 2016-12-10 DIAGNOSIS — E785 Hyperlipidemia, unspecified: Secondary | ICD-10-CM | POA: Diagnosis not present

## 2016-12-10 DIAGNOSIS — M25552 Pain in left hip: Secondary | ICD-10-CM | POA: Diagnosis not present

## 2016-12-10 DIAGNOSIS — I10 Essential (primary) hypertension: Secondary | ICD-10-CM

## 2016-12-10 MED ORDER — ROSUVASTATIN CALCIUM 5 MG PO TABS: 5.0000 mg | ORAL_TABLET | Freq: Every day | ORAL | 1 refills | Status: DC

## 2016-12-10 MED ORDER — LOSARTAN POTASSIUM-HCTZ 100-12.5 MG PO TABS: 1.0000 | ORAL_TABLET | Freq: Every day | ORAL | 1 refills | Status: DC

## 2016-12-10 MED ORDER — ESOMEPRAZOLE MAGNESIUM 40 MG PO CPDR: 40.0000 mg | DELAYED_RELEASE_CAPSULE | Freq: Every day | ORAL | 1 refills | Status: DC

## 2016-12-10 NOTE — Progress Notes (Signed)
Name: Charles Valencia   MRN: [id number]    DOB: 04-26-1931   Date:12/10/2016       Progress Note  Subjective  Chief Complaint  Chief Complaint  Patient presents with  . Follow-up    3 months   . Medication Refill  . Hip Pain    Left hips pain going down to his left leg     Hip Pain   There was no injury mechanism. The pain is present in the left hip, left leg and left thigh. The quality of the pain is described as aching. The pain is at a severity of 5/10. The pain is moderate. Pertinent negatives include no inability to bear weight. He reports no foreign bodies present. The symptoms are aggravated by movement (bending over etc.).  Gastroesophageal Reflux  He reports no abdominal pain, no chest pain, no coughing, no dysphagia, no heartburn or no nausea. This is a chronic problem. The problem has been unchanged. The symptoms are aggravated by certain foods. He has tried a PPI for the symptoms. The treatment provided significant relief.  Hyperlipidemia  This is a chronic problem. The problem is controlled. Recent lipid tests were reviewed and are normal. Pertinent negatives include no chest pain, myalgias or shortness of breath. Current antihyperlipidemic treatment includes statins.  Hypertension  This is a chronic problem. The problem is unchanged. Pertinent negatives include no blurred vision, chest pain, headaches, palpitations or shortness of breath. Past treatments include angiotensin blockers and diuretics. There is no history of kidney disease, CAD/MI or CVA.     Past Medical History:  Diagnosis Date  . Diabetes ([place])   . GERD (gastroesophageal reflux disease)   . Glaucoma   . Hyperlipidemia   . Hypertension   . Paroxysmal atrial fibrillation ([place])    Followed by Cardiology.    History reviewed. No pertinent surgical history.  Family History  Problem Relation Age of Onset  . Bladder Cancer Neg Hx   . Kidney cancer Neg Hx   . AAA (abdominal aortic aneurysm) Neg Hx      Social History   Socioeconomic History  . Marital status: Single    Spouse name: Not on file  . Number of children: Not on file  . Years of education: Not on file  . Highest education level: Not on file  Social Needs  . Financial resource strain: Not on file  . Food insecurity - worry: Not on file  . Food insecurity - inability: Not on file  . Transportation needs - medical: Not on file  . Transportation needs - non-medical: Not on file  Occupational History  . Not on file  Tobacco Use  . Smoking status: Former Research scientist (life sciences)  . Smokeless tobacco: Never Used  Substance and Sexual Activity  . Alcohol use: No    Alcohol/week: 0.0 oz  . Drug use: No  . Sexual activity: No  Other Topics Concern  . Not on file  Social History Narrative  . Not on file     Current Outpatient Medications:  .  aspirin 81 MG tablet, Take 81 mg by mouth daily., Disp: , Rfl:  .  ELIQUIS 2.5 MG TABS tablet, TAKE 1 TABLET BY MOUTH TWICE A DAY., Disp: 180 tablet, Rfl: 1 .  esomeprazole (NEXIUM) 40 MG capsule, Take 1 capsule (40 mg total) by mouth daily at 12 noon., Disp: 90 capsule, Rfl: 1 .  losartan-hydrochlorothiazide (HYZAAR) 100-12.5 MG tablet, Take 1 tablet by mouth daily., Disp: 90 tablet, Rfl: 1 .  potassium chloride (K-DUR) 10 MEQ tablet, Take 1 tablet (10 mEq total) by mouth daily., Disp: 30 tablet, Rfl: 2 .  rosuvastatin (CRESTOR) 5 MG tablet, Take 1 tablet (5 mg total) by mouth daily., Disp: 90 tablet, Rfl: 1 .  tamsulosin (FLOMAX) 0.4 MG CAPS capsule, Take 1 capsule (0.4 mg total) by mouth daily., Disp: 30 capsule, Rfl: 11 .  traMADol (ULTRAM) 50 MG tablet, Take by mouth every 12 (twelve) hours as needed., Disp: , Rfl:  .  Vitamin D, Ergocalciferol, (DRISDOL) 50000 units CAPS capsule, Take 1 capsule (50,000 Units total) by mouth once a week. For 12 weeks, Disp: 12 capsule, Rfl: 0  No Known Allergies   Review of Systems  Eyes: Negative for blurred vision.  Respiratory: Negative for cough and  shortness of breath.   Cardiovascular: Negative for chest pain and palpitations.  Gastrointestinal: Negative for abdominal pain, dysphagia, heartburn and nausea.  Musculoskeletal: Negative for myalgias.  Neurological: Negative for headaches.      Objective  Vitals:   12/10/16 1005  BP: 132/68  Pulse: 63  Resp: 16  Temp: (!) 97.5 F (36.4 C)  TempSrc: Oral  SpO2: 98%  Weight: 210 lb 12.8 oz (95.6 kg)    Physical Exam  Constitutional: He is well-developed, well-nourished, and in no distress.  Cardiovascular: Normal rate, regular rhythm and normal heart sounds.  No murmur heard. Pulmonary/Chest: Effort normal and breath sounds normal. He has no wheezes.  Musculoskeletal:       Left hip: He exhibits tenderness. He exhibits no bony tenderness, no swelling and no deformity.       Legs: Nursing note and vitals reviewed.      Recent Results (from the past 2160 hour(s))  BASIC METABOLIC PANEL WITH GFR     Status: Abnormal   Collection Time: 09/16/16  1:53 PM  Result Value Ref Range   Glucose, Bld 104 65 - 139 mg/dL    Comment: .        Non-fasting reference interval .    BUN 14 7 - 25 mg/dL   Creat 0.97 0.70 - 1.11 mg/dL    Comment: For patients >73 years of age, the reference limit for Creatinine is approximately 13% higher for people identified as African-American. .    GFR, Est Non African American 72 > OR = 60 mL/min/1.13m2   GFR, Est African American 83 > OR = 60 mL/min/1.29m2   BUN/Creatinine Ratio NOT APPLICABLE 6 - 22 (calc)   Sodium 141 135 - 146 mmol/L   Potassium 2.8 (L) 3.5 - 5.3 mmol/L   Chloride 100 98 - 110 mmol/L   CO2 33 (H) 20 - 32 mmol/L   Calcium 8.8 8.6 - 10.3 mg/dL  BASIC METABOLIC PANEL WITH GFR     Status: Abnormal   Collection Time: 09/23/16 10:36 AM  Result Value Ref Range   Glucose, Bld 106 (H) 65 - 99 mg/dL    Comment: .            Fasting reference interval . For someone without known diabetes, a glucose value between 100 and  125 mg/dL is consistent with prediabetes and should be confirmed with a follow-up test. .    BUN 15 7 - 25 mg/dL   Creat 1.12 (H) 0.70 - 1.11 mg/dL    Comment: For patients >21 years of age, the reference limit for Creatinine is approximately 13% higher for people identified as African-American. .    GFR, Est Non African American 60 > OR =  60 mL/min/1.32m2   GFR, Est African American 70 > OR = 60 mL/min/1.75m2   BUN/Creatinine Ratio 13 6 - 22 (calc)   Sodium 143 135 - 146 mmol/L   Potassium 3.1 (L) 3.5 - 5.3 mmol/L   Chloride 102 98 - 110 mmol/L   CO2 29 20 - 32 mmol/L   Calcium 9.3 8.6 - 10.3 mg/dL     Assessment & Plan  1. Hyperlipidemia, unspecified hyperlipidemia type FLP at goal - rosuvastatin (CRESTOR) 5 MG tablet; Take 1 tablet (5 mg total) by mouth daily.  Dispense: 90 tablet; Refill: 1  2. Gastroesophageal reflux disease, esophagitis presence not specified  - esomeprazole (NEXIUM) 40 MG capsule; Take 1 capsule (40 mg total) by mouth daily at 12 noon.  Dispense: 90 capsule; Refill: 1  3. Essential hypertension  - losartan-hydrochlorothiazide (HYZAAR) 100-12.5 MG tablet; Take 1 tablet by mouth daily.  Dispense: 90 tablet; Refill: 1  4. Acute hip pain, left Suspect osteoarthritis, obtain x-rays, follow-up after review of imaging - DG HIP UNILAT WITH PELVIS 2-3 VIEWS LEFT; Future   Temica Righetti [facility] A[facility] 12/10/2016 10:21 AM

## 2017-01-12 ENCOUNTER — Other Ambulatory Visit: Payer: Self-pay

## 2017-01-12 ENCOUNTER — Other Ambulatory Visit: Payer: Medicare Other

## 2017-01-12 DIAGNOSIS — R972 Elevated prostate specific antigen [PSA]: Secondary | ICD-10-CM | POA: Diagnosis not present

## 2017-01-13 LAB — PSA: PROSTATE SPECIFIC AG, SERUM: 35.7 ng/mL — AB (ref 0.0–4.0)

## 2017-01-16 ENCOUNTER — Encounter: Payer: Self-pay | Admitting: Urology

## 2017-01-16 ENCOUNTER — Ambulatory Visit (INDEPENDENT_AMBULATORY_CARE_PROVIDER_SITE_OTHER): Payer: Medicare Other | Admitting: Urology

## 2017-01-16 VITALS — BP 196/74 | HR 62 | Ht 72.0 in | Wt 215.0 lb

## 2017-01-16 DIAGNOSIS — R972 Elevated prostate specific antigen [PSA]: Secondary | ICD-10-CM

## 2017-01-16 DIAGNOSIS — R351 Nocturia: Secondary | ICD-10-CM | POA: Diagnosis not present

## 2017-01-16 LAB — BLADDER SCAN AMB NON-IMAGING: SCAN RESULT: 205

## 2017-01-16 NOTE — Progress Notes (Signed)
01/16/2017 10:36 AM   Charles Valencia February 23, 1931 [id number]  Referring provider: Roselee Nova, MD [address] [place], [place] [address]  Chief Complaint  Patient presents with  . Follow-up  . Elevated PSA    HPI: 84 year old male with a history of elevated PSA and significant urinary symptoms who returns for routine follow-up.  He initially presented in 10/2016 with elevated PSA to 16.9.  PSA was checked by his PCP on 09/10/2016 and markedly elevated to 16.9. Last known PSA in 2012 8.4.  Rectal exam did show an enlarged gland which was massively enlarged with induration on the right lateral ridge without a discrete nodule although the exam was somewhat limited to habitus  PSA was repeated on 01/12/2017 and is now significantly elevated up to 35.7 which is quite worrisome for prostate cancer.  He is on chronic anticoagulation, Eliquis for A. fib.  Since last visit, he was started on Flomax for significant urinary symptoms including severe nocturia, 3-4x.  No real change in urinary symptoms.  PVR ~200 cc today.  Continues to have daytime frequency and weak stream.  No weight loss or back pain.     PMH: Past Medical History:  Diagnosis Date  . Diabetes ([place])   . GERD (gastroesophageal reflux disease)   . Glaucoma   . Hyperlipidemia   . Hypertension   . Paroxysmal atrial fibrillation ([place])    Followed by Cardiology.    Surgical History: No past surgical history on file.  Home Medications:  Allergies as of 01/16/2017   No Known Allergies     Medication List        Accurate as of 01/16/17 10:36 AM. Always use your most recent med list.          aspirin 81 MG tablet Take 81 mg by mouth daily.   ELIQUIS 2.5 MG Tabs tablet Generic drug:  apixaban TAKE 1 TABLET BY MOUTH TWICE A DAY.   esomeprazole 40 MG capsule Commonly known as:  NEXIUM Take 1 capsule (40 mg total) by mouth daily at 12 noon.   losartan-hydrochlorothiazide 100-12.5 MG  tablet Commonly known as:  HYZAAR Take 1 tablet by mouth daily.   potassium chloride 10 MEQ tablet Commonly known as:  K-DUR Take 1 tablet (10 mEq total) by mouth daily.   rosuvastatin 5 MG tablet Commonly known as:  CRESTOR Take 1 tablet (5 mg total) by mouth daily.   tamsulosin 0.4 MG Caps capsule Commonly known as:  FLOMAX Take 1 capsule (0.4 mg total) by mouth daily.   traMADol 50 MG tablet Commonly known as:  ULTRAM Take by mouth every 12 (twelve) hours as needed.   Vitamin D (Ergocalciferol) 50000 units Caps capsule Commonly known as:  DRISDOL Take 1 capsule (50,000 Units total) by mouth once a week. For 12 weeks       Allergies: No Known Allergies  Family History: Family History  Problem Relation Age of Onset  . Bladder Cancer Neg Hx   . Kidney cancer Neg Hx   . AAA (abdominal aortic aneurysm) Neg Hx     Social History:  reports that he has quit smoking. he has never used smokeless tobacco. He reports that he does not drink alcohol or use drugs.  ROS: UROLOGY Frequent Urination?: Yes Hard to postpone urination?: No Burning/pain with urination?: No Get up at night to urinate?: Yes Leakage of urine?: No Urine stream starts and stops?: No Trouble starting stream?: No Do you have to strain to  urinate?: No Blood in urine?: No Urinary tract infection?: No Sexually transmitted disease?: No Injury to kidneys or bladder?: No Painful intercourse?: No Weak stream?: No Erection problems?: No Penile pain?: No  Gastrointestinal Nausea?: No Vomiting?: No Indigestion/heartburn?: No Diarrhea?: No Constipation?: No  Constitutional Fever: No Night sweats?: No Weight loss?: Yes Fatigue?: No  Skin Skin rash/lesions?: No Itching?: No  Eyes Blurred vision?: No Double vision?: No  Ears/Nose/Throat Sore throat?: No Sinus problems?: No  Hematologic/Lymphatic Swollen glands?: No Easy bruising?: No  Cardiovascular Leg swelling?: No Chest pain?:  No  Respiratory Cough?: No Shortness of breath?: No  Endocrine Excessive thirst?: No  Musculoskeletal Back pain?: No Joint pain?: No  Neurological Headaches?: No Dizziness?: No  Psychologic Depression?: No Anxiety?: No  Physical Exam: BP (!) 196/74   Pulse 62   Ht 6' (1.829 m)   Wt 215 lb (97.5 kg)   BMI 29.16 kg/m   Constitutional:  Alert and oriented, No acute distress. HEENT: [place] AT, moist mucus membranes.  Trachea midline, no masses. Cardiovascular: No clubbing, cyanosis, or edema. Respiratory: Normal respiratory effort, no increased work of breathing. GI: Abdomen is soft, nontender, nondistended, no abdominal masses GU: No CVA tenderness.  Skin: No rashes, bruises or suspicious lesions. Neurologic: Grossly intact, no focal deficits, moving all 4 extremities. Psychiatric: Normal mood and affect.  Laboratory Data: Lab Results  Component Value Date   WBC 5.8 05/01/2016   HGB 15.2 05/01/2016   HCT 46.3 05/01/2016   MCV 87.2 05/01/2016   PLT 191 05/01/2016    Lab Results  Component Value Date   CREATININE 1.12 (H) 09/23/2016     Lab Results  Component Value Date   HGBA1C 6.9 07/02/2016    Urinalysis N/A  Pertinent Imaging: Results for orders placed or performed in visit on 01/16/17  BLADDER SCAN AMB NON-IMAGING  Result Value Ref Range   Scan Result 205     Assessment & Plan:    1. Elevated PSA PSA continues to rise with fairly quick doubling time concerning for more aggressive prostate cancer would warrant treatment.    At this point in time, I do feel that he would benefit from prostate biopsy and treatment as needed.    We discussed prostate biopsy in detail including the procedure itself, the risks of blood in the urine, stool, and ejaculate, serious infection, and discomfort. He is willing to proceed with this as discussed.  Will obtain clearance from Dr. Clayborn Bigness to hold Eliquis.    All questions answered.   2. BPH with  nocturia Continue flomax Suspect urinary symptoms may be related possible underlying prostate cancer as above Will continue to monitor Reviewed warning symptoms for urinary retention   Return in about 2 weeks (around 01/30/2017) for prostate biopsy, will need cardiac clearance.  [name], MD  [facility] [address] [place], [place] [address] [phone]

## 2017-01-20 ENCOUNTER — Telehealth: Payer: Self-pay

## 2017-01-20 NOTE — Telephone Encounter (Signed)
Received clearance from Dr. Clayborn Bigness for pt to stop eliquis 3 days prior to prostate bx. Spoke with Charles Valencia, pt niece/POA, in reference to stopping medication. Charles Valencia voiced understanding and requested prep info sheet be mailed to her also. Prep info and appt info mailed to [facility].

## 2017-02-03 ENCOUNTER — Other Ambulatory Visit: Payer: Medicare Other | Admitting: Urology

## 2017-02-17 ENCOUNTER — Ambulatory Visit: Payer: Medicare Other | Admitting: Urology

## 2017-02-18 ENCOUNTER — Ambulatory Visit: Payer: Medicare Other | Admitting: Urology

## 2017-02-25 ENCOUNTER — Telehealth: Payer: Self-pay

## 2017-02-26 ENCOUNTER — Other Ambulatory Visit: Payer: Medicare Other | Admitting: Urology

## 2017-02-26 MED ORDER — VALSARTAN-HYDROCHLOROTHIAZIDE 160-12.5 MG PO TABS: 1.0000 | ORAL_TABLET | Freq: Every day | ORAL | 1 refills | Status: DC

## 2017-02-26 NOTE — Telephone Encounter (Signed)
Based on Dr.Shah's note looks like the new rx should replace the old one, can you please review and advise. Thanks

## 2017-02-26 NOTE — Telephone Encounter (Signed)
Pharmacy calling,  just received script on valsartan-hydrochlorothiazide (DIOVAN HCT) 160-12.5 MG tablet, 100-12.5mg  will fill on 02/06/17, is that suppose to be replaced with 160-12.5mg . Please advise. Call back [phone]

## 2017-02-27 NOTE — Telephone Encounter (Signed)
Yes. It was because of the recall Please tell him to stop Losartan/hctz

## 2017-02-27 NOTE — Telephone Encounter (Signed)
Pt is aware and verbalized understanding.

## 2017-03-02 ENCOUNTER — Telehealth: Payer: Self-pay | Admitting: Urology

## 2017-03-02 NOTE — Telephone Encounter (Signed)
Patient called and cx the biopsy again. He did not want to do the enema.  Sharyn Lull

## 2017-03-02 NOTE — Telephone Encounter (Signed)
Please reach out to this patient.  I am fairly certain he has prostate cancer and possibly an aggressive form which needs to be treated.  Failure to dx and treat could lead to painful metastatic diease.  Please also reach out to him.   \Also, please walk him through an enema and reassure him.  This really should not be a big deal.    [name], MD

## 2017-03-03 ENCOUNTER — Other Ambulatory Visit: Payer: Medicare Other | Admitting: Urology

## 2017-03-03 ENCOUNTER — Telehealth: Payer: Self-pay | Admitting: Family Medicine

## 2017-03-03 NOTE — Telephone Encounter (Signed)
Copied from [place] [id number]. Topic: Quick Communication - See Telephone Encounter >> Mar 03, 2017 10:09 AM Robina Ade, Helene Kelp D wrote: CRM for notification. See Telephone encounter for: 03/03/17. Logan with [facility] called and has questions about patient medication valsartan-hydrochlorothiazide (DIOVAN HCT) 160-12.5 MG tablet that have recent been sent over. He can be reached at [phone].

## 2017-03-03 NOTE — Telephone Encounter (Signed)
Spoke to pharmacy, pt is supposed to start diovan. Tried to call pt several times with no answer or voicemail.

## 2017-03-06 NOTE — Telephone Encounter (Signed)
Pt does not have a phone all calls go to niece, Joycelyn Schmid. Margaret on [employer]. Spoke with Joycelyn Schmid in reference to bx and Dr. Cherrie Gauze concerns. Encouraged Joycelyn Schmid to help find someone that can help pt with enema. Joycelyn Schmid voiced understanding of whole conversation and requested to reschedule bx and results appts. Both appts rescheduled.

## 2017-03-11 ENCOUNTER — Ambulatory Visit: Payer: Medicare Other | Admitting: Family Medicine

## 2017-03-12 ENCOUNTER — Ambulatory Visit: Payer: Medicare Other | Admitting: Urology

## 2017-04-07 ENCOUNTER — Encounter: Payer: Self-pay | Admitting: Urology

## 2017-04-07 ENCOUNTER — Ambulatory Visit (INDEPENDENT_AMBULATORY_CARE_PROVIDER_SITE_OTHER): Payer: Medicare Other | Admitting: Urology

## 2017-04-07 ENCOUNTER — Other Ambulatory Visit: Payer: Self-pay | Admitting: Urology

## 2017-04-07 VITALS — BP 137/92 | HR 75 | Resp 16 | Ht 72.0 in | Wt 210.2 lb

## 2017-04-07 DIAGNOSIS — R972 Elevated prostate specific antigen [PSA]: Secondary | ICD-10-CM

## 2017-04-07 DIAGNOSIS — C61 Malignant neoplasm of prostate: Secondary | ICD-10-CM | POA: Diagnosis not present

## 2017-04-07 NOTE — Progress Notes (Signed)
   04/07/17  CC:  Chief Complaint  Patient presents with  . Prostate Biopsy    HPI: 84 year old male with rapidly rising PSA recently up to 35.7 on 01/2017 with a grossly abnormal rectal exam.  He does also have baseline urinary symptoms with a mildly elevated PVR.  He returns today for prostate biopsy.  He is rescheduled his biopsy several times now due to unwillingness to do the rectal prep.    Blood pressure (!) 137/92, pulse 75, resp. rate 16, height 6' (1.829 m), weight 210 lb 3.2 oz (95.3 kg), SpO2 98 %. NED. A&Ox3.   No respiratory distress   Abd soft, NT, ND Normal sphincter tone  Prostate Biopsy Procedure   Informed consent was obtained after discussing risks/benefits of the procedure.  A time out was performed to ensure correct patient identity.  Pre-Procedure: - Gentamicin given prophylactically - Levaquin 500 mg administered PO -Transrectal Ultrasound performed revealing a 53.15  gm prostate -Significant median lobe but there is diffuse heterogeneity throughout the prostate with irregular contour, right greater than left asymmetry  Procedure: - Prostate block performed using 10 cc 1% lidocaine and biopsies taken from sextant areas, a total of 12 under ultrasound guidance.  Post-Procedure: - Patient tolerated the procedure well - He was counseled to seek immediate medical attention if experiences any severe pain, significant bleeding, or fevers - Return in two week to discuss biopsy results   [name], MD

## 2017-04-15 ENCOUNTER — Telehealth: Payer: Self-pay | Admitting: Urology

## 2017-04-15 DIAGNOSIS — C61 Malignant neoplasm of prostate: Secondary | ICD-10-CM

## 2017-04-15 LAB — PATHOLOGY REPORT

## 2017-04-15 NOTE — Telephone Encounter (Signed)
As suspected, his prostate biopsy shows high risk prostate cancer.  He will need a CT scan and a bone scan prior to his follow-up next week.  I have placed order for the studies.  I will call him and let him know about his cancer diagnosis.  Please help me arrange to expedite his care.  [name], MD

## 2017-04-16 ENCOUNTER — Telehealth: Payer: Self-pay | Admitting: Urology

## 2017-04-16 NOTE — Telephone Encounter (Signed)
FYI CT and Bone scan have been approved  Should I tell them to hold off on calling him for a few days? I just want to make sure you have spoken to him first.  Sharyn Lull

## 2017-04-16 NOTE — Telephone Encounter (Signed)
Spoke with him.  Good to call and arrange.    [name], MD

## 2017-04-16 NOTE — Telephone Encounter (Signed)
Can I go ahead and cx his results app?  Sharyn Lull

## 2017-04-17 ENCOUNTER — Other Ambulatory Visit: Payer: Self-pay | Admitting: Urology

## 2017-04-21 ENCOUNTER — Ambulatory Visit: Payer: Self-pay | Admitting: Urology

## 2017-04-23 ENCOUNTER — Ambulatory Visit: Payer: Self-pay | Admitting: Urology

## 2017-04-24 ENCOUNTER — Encounter
Admission: RE | Admit: 2017-04-24 | Discharge: 2017-04-24 | Disposition: A | Discharge status: Home / Self Care | Payer: Medicare Other | Source: Ambulatory Visit | Attending: Urology | Admitting: Urology

## 2017-04-24 ENCOUNTER — Ambulatory Visit
Admission: RE | Admit: 2017-04-24 | Discharge: 2017-04-24 | Disposition: A | Discharge status: Home / Self Care | Payer: Medicare Other | Source: Ambulatory Visit | Attending: Urology | Admitting: Urology

## 2017-04-24 DIAGNOSIS — C61 Malignant neoplasm of prostate: Secondary | ICD-10-CM

## 2017-04-24 DIAGNOSIS — I700 Atherosclerosis of aorta: Secondary | ICD-10-CM | POA: Diagnosis not present

## 2017-04-24 DIAGNOSIS — I2510 Atherosclerotic heart disease of native coronary artery without angina pectoris: Secondary | ICD-10-CM | POA: Diagnosis not present

## 2017-04-24 LAB — POCT I-STAT CREATININE: Creatinine, Ser: 1.1 mg/dL (ref 0.61–1.24)

## 2017-04-24 MED ORDER — IOPAMIDOL (ISOVUE-300) INJECTION 61%
100.0000 mL | Freq: Once | INTRAVENOUS | Status: AC | PRN
  Administered 2017-04-24: 100 mL via INTRAVENOUS

## 2017-04-24 MED ORDER — TECHNETIUM TC 99M MEDRONATE IV KIT
23.0500 | PACK | Freq: Once | INTRAVENOUS | Status: AC | PRN
  Administered 2017-04-24: 23.05 via INTRAVENOUS

## 2017-04-27 ENCOUNTER — Ambulatory Visit (INDEPENDENT_AMBULATORY_CARE_PROVIDER_SITE_OTHER): Payer: Medicare Other | Admitting: Family Medicine

## 2017-04-27 ENCOUNTER — Encounter: Payer: Self-pay | Admitting: Family Medicine

## 2017-04-27 VITALS — BP 130/86 | HR 59 | Temp 97.9°F | Resp 14 | Ht 73.0 in | Wt 211.3 lb

## 2017-04-27 DIAGNOSIS — K219 Gastro-esophageal reflux disease without esophagitis: Secondary | ICD-10-CM

## 2017-04-27 DIAGNOSIS — E782 Mixed hyperlipidemia: Secondary | ICD-10-CM | POA: Diagnosis not present

## 2017-04-27 DIAGNOSIS — I480 Paroxysmal atrial fibrillation: Secondary | ICD-10-CM | POA: Diagnosis not present

## 2017-04-27 DIAGNOSIS — Z5181 Encounter for therapeutic drug level monitoring: Secondary | ICD-10-CM | POA: Diagnosis not present

## 2017-04-27 DIAGNOSIS — E119 Type 2 diabetes mellitus without complications: Secondary | ICD-10-CM

## 2017-04-27 DIAGNOSIS — E876 Hypokalemia: Secondary | ICD-10-CM | POA: Diagnosis not present

## 2017-04-27 DIAGNOSIS — I10 Essential (primary) hypertension: Secondary | ICD-10-CM

## 2017-04-27 DIAGNOSIS — E1129 Type 2 diabetes mellitus with other diabetic kidney complication: Secondary | ICD-10-CM | POA: Insufficient documentation

## 2017-04-27 NOTE — Assessment & Plan Note (Signed)
Controlled, no red flags

## 2017-04-27 NOTE — Assessment & Plan Note (Signed)
Foot exam by MD today; refer to eye doctor; check A1c today

## 2017-04-27 NOTE — Assessment & Plan Note (Signed)
Well controlled 

## 2017-04-27 NOTE — Patient Instructions (Addendum)
It looks like you will be due to see Dr. Clayborn Bigness in about 2-3 weeks, so please schedule an appointment with him We'll have you see your eye doctor soon Try to follow the DASH guidelines (DASH stands for Dietary Approaches to Stop Hypertension). Try to limit the sodium in your diet to no more than 1,500mg  of sodium per day. Certainly try to not exceed 2,000 mg per day at the very most. Do not add salt when cooking or at the table.  Check the sodium amount on labels when shopping, and choose items lower in sodium when given a choice. Avoid or limit foods that already contain a lot of sodium. Eat a diet rich in fruits and vegetables and whole grains, and try to lose weight if overweight or obese Please do see your eye doctor regularly, and have your eyes examined every year (or more often per his or her recommendation) Check your feet every night and let me know right away of any sores, infections, numbness, etc. Try to limit sweets, white bread, white rice, white potatoes It is okay with me for you to not check your fingerstick blood sugars (per [employer] of [employer]ocrinology Best Practices), unless you are interested and feel it would be helpful for you

## 2017-04-27 NOTE — Assessment & Plan Note (Signed)
Check cholesterol today; continue statin; limit foods from cows and pigs

## 2017-04-27 NOTE — Progress Notes (Signed)
BP 130/86   Pulse (!) 59   Temp 97.9 F (36.6 C) (Oral)   Resp 14   Ht 6\' 1"  (1.854 m)   Wt 211 lb 4.8 oz (95.8 kg)   SpO2 97%   BMI 27.88 kg/m    Subjective:    Patient ID: Charles Valencia, male    DOB: 05/08/1931, 84 y.o.   MRN: [id number]  HPI: Charles Valencia is a 84 y.o. male  Chief Complaint  Patient presents with  . Follow-up    HPI Patient is new to me; previous provider left our practice  He has type 2 diabetes; diet-controlled; no dry mouth; probably due for eye exam; cut back on sugary drinks; avoids iced tea Lab Results  Component Value Date   HGBA1C 6.9 07/02/2016   He is out every day walking; likes to get along with people; happy and likes people  High cholesterol; taking rosuvastatin; no problems with the medicine; used to eat fatty meats, but has cut down to [place] sausage  Atrial fibrillation; seeing cardiologist, Dr. Clayborn Bigness; last visit Nov 2018; on eliqus; no bleeding  Just had prostate biopsy; might have to be in the hospital; working with Dr. Erlene Quan  HTN; likes pepper, but not adding salt; no chest pain  GERD; controlled; no triggers; no blood in the stool and he checks  Depression screen [facility] 2/9 04/27/2017 12/10/2016 09/16/2016 09/10/2016 07/02/2016  Decreased Interest 0 0 0 0 0  Down, Depressed, Hopeless 0 0 0 0 0  PHQ - 2 Score 0 0 0 0 0    Relevant past medical, surgical, family and social history reviewed Past Medical History:  Diagnosis Date  . Diabetes ([place])   . GERD (gastroesophageal reflux disease)   . Glaucoma   . Hyperlipidemia   . Hypertension   . Paroxysmal atrial fibrillation ([place])    Followed by Cardiology.   History reviewed. No pertinent surgical history. Family History  Problem Relation Age of Onset  . Bladder Cancer Neg Hx   . Kidney cancer Neg Hx   . AAA (abdominal aortic aneurysm) Neg Hx    Social History   Tobacco Use  . Smoking status: Former Research scientist (life sciences)  . Smokeless tobacco: Never Used  Substance Use Topics    . Alcohol use: No    Alcohol/week: 0.0 oz  . Drug use: No    Interim medical history since last visit reviewed. Allergies and medications reviewed  Review of Systems Per HPI unless specifically indicated above     Objective:    BP 130/86   Pulse (!) 59   Temp 97.9 F (36.6 C) (Oral)   Resp 14   Ht 6\' 1"  (1.854 m)   Wt 211 lb 4.8 oz (95.8 kg)   SpO2 97%   BMI 27.88 kg/m   Wt Readings from Last 3 Encounters:  04/27/17 211 lb 4.8 oz (95.8 kg)  04/07/17 210 lb 3.2 oz (95.3 kg)  01/16/17 215 lb (97.5 kg)    Physical Exam  Constitutional: He appears well-developed and well-nourished. No distress.  HENT:  Head: Normocephalic and atraumatic.  Eyes: EOM are normal. No scleral icterus.  Neck: No thyromegaly present.  Cardiovascular: An irregularly irregular rhythm present. Bradycardia present.  Pulmonary/Chest: Effort normal and breath sounds normal.  Abdominal: Soft. Bowel sounds are normal. He exhibits no distension.  Musculoskeletal: He exhibits no edema.  Neurological: Coordination normal.  Skin: Skin is warm and dry. No pallor.  Psychiatric: He has a normal mood and affect. His behavior  is normal. Judgment and thought content normal.   Diabetic Foot Form - Detailed   Diabetic Foot Exam - detailed Diabetic Foot exam was performed with the following findings:  Yes 04/27/2017  1:56 PM  Visual Foot Exam completed.:  Yes  Pulse Foot Exam completed.:  Yes  Right Dorsalis Pedis:  Present Left Dorsalis Pedis:  Present  Sensory Foot Exam Completed.:  Yes Semmes-Weinstein Monofilament Test R Site 1-Great Toe:  Pos L Site 1-Great Toe:  Pos        Results for orders placed or performed during the hospital encounter of 04/24/17  I-STAT creatinine  Result Value Ref Range   Creatinine, Ser 1.10 0.61 - 1.24 mg/dL      Assessment & Plan:   Problem List Items Addressed This Visit      Cardiovascular and Mediastinum   BP (high blood pressure)    Well-controlled       Relevant Orders   Aldosterone + renin activity w/ ratio   Atrial fibrillation (HCC)    Rate controlled; to see cardiologist in 2-3 weeks        Digestive   GERD (gastroesophageal reflux disease)    Controlled, no red flags        Endocrine   Type 2 diabetes mellitus ([place]) - Primary    Foot exam by MD today; refer to eye doctor; check A1c today      Relevant Orders   Microalbumin / creatinine urine ratio   Lipid panel   Hemoglobin A1c   Ambulatory referral to Ophthalmology     Other   Hyperlipidemia    Check cholesterol today; continue statin; limit foods from cows and pigs       Other Visit Diagnoses    Medication monitoring encounter       Relevant Orders   COMPLETE METABOLIC PANEL WITH GFR   Hypokalemia       Relevant Orders   Magnesium   Aldosterone + renin activity w/ ratio       Follow up plan: Return in about 6 months (around 10/27/2017) for follow-up visit with Dr. Sanda Klein; Medicare wellness with Ammie when due.  An after-visit summary was printed and given to the patient at [facility].  Please see the patient instructions which may contain other information and recommendations beyond what is mentioned above in the assessment and plan.  No orders of the defined types were placed in this encounter.   Orders Placed This Encounter  Procedures  . Microalbumin / creatinine urine ratio  . Lipid panel  . Hemoglobin A1c  . COMPLETE METABOLIC PANEL WITH GFR  . Magnesium  . Aldosterone + renin activity w/ ratio  . Ambulatory referral to Ophthalmology

## 2017-04-27 NOTE — Assessment & Plan Note (Signed)
Rate controlled; to see cardiologist in 2-3 weeks

## 2017-05-01 ENCOUNTER — Ambulatory Visit (INDEPENDENT_AMBULATORY_CARE_PROVIDER_SITE_OTHER): Payer: Medicare Other | Admitting: Urology

## 2017-05-01 ENCOUNTER — Encounter: Payer: Self-pay | Admitting: Urology

## 2017-05-01 VITALS — BP 143/75 | HR 84

## 2017-05-01 DIAGNOSIS — C61 Malignant neoplasm of prostate: Secondary | ICD-10-CM | POA: Diagnosis not present

## 2017-05-01 DIAGNOSIS — R351 Nocturia: Secondary | ICD-10-CM | POA: Diagnosis not present

## 2017-05-01 MED ORDER — DEGARELIX ACETATE 120 MG ~~LOC~~ SOLR
240.0000 mg | Freq: Once | SUBCUTANEOUS | Status: AC
  Administered 2017-05-01: 240 mg via SUBCUTANEOUS

## 2017-05-01 NOTE — Progress Notes (Signed)
Firmagon Sub Q Injection  Due to Prostate Cancer patient is present today for a Firmagon Injection.   Medication: Mills Koller (Degarelix)  Dose: 240mg  Location: right & left upper abdomen Lot: [id number] Exp: 03/2019  Patient tolerated well, no complications were noted  Performed by: Fonnie Jarvis, CMA and Cristie Hem, CMA  Follow up: 1 month

## 2017-05-01 NOTE — Patient Instructions (Addendum)
Discussed importance of bone health on ADT, recommend 1000-1200 mg daily calcium suppliment and [phone] IU vit D daily.  Also encouraged weight being exercises and cardiovascular health.    Leuprolide injection What is this medicine? LEUPROLIDE (loo PROE lide) is a man-made hormone. It is used to treat the symptoms of prostate cancer. This medicine may also be used to treat children with early onset of puberty. It may be used for other hormonal conditions. This medicine may be used for other purposes; ask your health care provider or pharmacist if you have questions. COMMON BRAND NAME(S): Lupron What should I tell my health care provider before I take this medicine? They need to know if you have any of these conditions: -diabetes -heart disease or previous heart attack -high blood pressure -high cholesterol -pain or difficulty passing urine -spinal cord metastasis -stroke -tobacco smoker -an unusual or allergic reaction to leuprolide, benzyl alcohol, other medicines, foods, dyes, or preservatives -pregnant or trying to get pregnant -breast-feeding How should I use this medicine? This medicine is for injection under the skin or into a muscle. You will be taught how to prepare and give this medicine. Use exactly as directed. Take your medicine at regular intervals. Do not take your medicine more often than directed. It is important that you put your used needles and syringes in a special sharps container. Do not put them in a trash can. If you do not have a sharps container, call your pharmacist or healthcare provider to get one. A special MedGuide will be given to you by the pharmacist with each prescription and refill. Be sure to read this information carefully each time. Talk to your pediatrician regarding the use of this medicine in children. While this medicine may be prescribed for children as young as 8 years for selected conditions, precautions do apply. Overdosage: If you think you  have taken too much of this medicine contact a poison control center or emergency room at once. NOTE: This medicine is only for you. Do not share this medicine with others. What if I miss a dose? If you miss a dose, take it as soon as you can. If it is almost time for your next dose, take only that dose. Do not take double or extra doses. What may interact with this medicine? Do not take this medicine with any of the following medications: -chasteberry This medicine may also interact with the following medications: -herbal or dietary supplements, like black cohosh or DHEA -male hormones, like estrogens or progestins and birth control pills, patches, rings, or injections -male hormones, like testosterone This list may not describe all possible interactions. Give your health care provider a list of all the medicines, herbs, non-prescription drugs, or dietary supplements you use. Also tell them if you smoke, drink alcohol, or use illegal drugs. Some items may interact with your medicine. What should I watch for while using this medicine? Visit your doctor or health care professional for regular checks on your progress. During the first week, your symptoms may get worse, but then will improve as you continue your treatment. You may get hot flashes, increased bone pain, increased difficulty passing urine, or an aggravation of nerve symptoms. Discuss these effects with your doctor or health care professional, some of them may improve with continued use of this medicine. Male patients may experience a menstrual cycle or spotting during the first 2 months of therapy with this medicine. If this continues, contact your doctor or health care professional. What side effects  may I notice from receiving this medicine? Side effects that you should report to your doctor or health care professional as soon as possible: -allergic reactions like skin rash, itching or hives, swelling of the face, lips, or  tongue -breathing problems -chest pain -depression or memory disorders -pain in your legs or groin -pain at site where injected -severe headache -swelling of the feet and legs -visual changes -vomiting Side effects that usually do not require medical attention (report to your doctor or health care professional if they continue or are bothersome): -breast swelling or tenderness -decrease in sex drive or performance -diarrhea -hot flashes -loss of appetite -muscle, joint, or bone pains -nausea -redness or irritation at site where injected -skin problems or acne This list may not describe all possible side effects. Call your doctor for medical advice about side effects. You may report side effects to FDA at 1-800-FDA-1088. Where should I keep my medicine? Keep out of the reach of children. Store below 25 degrees C (77 degrees F). Do not freeze. Protect from light. Do not use if it is not clear or if there are particles present. Throw away any unused medicine after the expiration date. NOTE: This sheet is a summary. It may not cover all possible information. If you have questions about this medicine, talk to your doctor, pharmacist, or health care provider.  2018 Elsevier/Gold Standard (2015-08-13 10:54:35)

## 2017-05-02 LAB — COMPLETE METABOLIC PANEL WITH GFR
AG Ratio: 1.6 (calc) (ref 1.0–2.5)
ALBUMIN MSPROF: 4.4 g/dL (ref 3.6–5.1)
ALT: 21 U/L (ref 9–46)
AST: 23 U/L (ref 10–35)
Alkaline phosphatase (APISO): 71 U/L (ref 40–115)
BUN: 17 mg/dL (ref 7–25)
CALCIUM: 9.6 mg/dL (ref 8.6–10.3)
CO2: 30 mmol/L (ref 20–32)
CREATININE: 1.07 mg/dL (ref 0.70–1.11)
Chloride: 100 mmol/L (ref 98–110)
GFR, EST AFRICAN AMERICAN: 73 mL/min/{1.73_m2} (ref >=60)
GFR, Est Non African American: 63 mL/min/{1.73_m2} (ref >=60)
GLUCOSE: 91 mg/dL (ref 65–99)
Globulin: 2.7 g/dL (calc) (ref 1.9–3.7)
Potassium: 3.5 mmol/L (ref 3.5–5.3)
Sodium: 140 mmol/L (ref 135–146)
TOTAL PROTEIN: 7.1 g/dL (ref 6.1–8.1)
Total Bilirubin: 1 mg/dL (ref 0.2–1.2)

## 2017-05-02 LAB — ALDOSTERONE + RENIN ACTIVITY W/ RATIO
Aldosterone: <1 ng/dL
Renin Activity: 0.09 ng/mL/h — ABNORMAL LOW (ref 0.25–5.82)

## 2017-05-02 LAB — HEMOGLOBIN A1C
Hgb A1c MFr Bld: 6.8 % of total Hgb — ABNORMAL HIGH (ref <5.7)
Mean Plasma Glucose: 148 (calc)
eAG (mmol/L): 8.2 (calc)

## 2017-05-02 LAB — LIPID PANEL
CHOLESTEROL: 102 mg/dL (ref <200)
HDL: 43 mg/dL (ref >40)
LDL Cholesterol (Calc): 45 mg/dL (calc)
NON-HDL CHOLESTEROL (CALC): 59 mg/dL (ref <130)
TRIGLYCERIDES: 59 mg/dL (ref <150)
Total CHOL/HDL Ratio: 2.4 (calc) (ref <5.0)

## 2017-05-02 LAB — MICROALBUMIN / CREATININE URINE RATIO
Creatinine, Urine: 78 mg/dL (ref 20–320)
MICROALB/CREAT RATIO: 219 ug/mg{creat} — AB (ref <30)
Microalb, Ur: 17.1 mg/dL

## 2017-05-02 LAB — MAGNESIUM: Magnesium: 1.8 mg/dL (ref 1.5–2.5)

## 2017-05-03 ENCOUNTER — Encounter: Payer: Self-pay | Admitting: Urology

## 2017-05-03 NOTE — Progress Notes (Signed)
05/01/2017 3:14 PM   Charles Valencia 1931-11-15 [id number]  Referring provider: Roselee Nova, MD [address] [place], [place] [address]    Chief Complaint  Patient presents with  . Prostate Cancer    scan results    HPI: 84 year old male with newly diagnosed high risk prostate cancer he returns today for follow-up.  He initially presented in 10/2016 with elevated PSA to 16.9.  PSA was checked by his PCP on 09/10/2016 and markedly elevated to 16.9. Last known PSA in 2012 8.4.  Rectal exam did show an enlarged gland which was massively enlarged with induration on the right lateral ridge without a discrete nodule although the exam was somewhat limited to habitus.  PSA was repeated on 01/12/2017 arose to 35.7.  Patient was extremely hesitant to undergo prostate biopsy due to concern for the enema thus this was delayed until 04/15/2017.  Ultimately prostate biopsy on this day revealed high risk prostate cancer, Gleason 4+5 up to 100% of the tissue including all biopsies right side of the biopsy in 2 of the left.  He underwent staging with CT scan and bone scan 04/2017 both of which are negative for any evidence of metastatic disease.  Notably, there is borderline prominence of the seminal vesicles but otherwise no lymphadenopathy.  Have baseline significant urinary symptoms recently started on Flomax as well as nocturia x3-4 and borderline elevated PVR to the 200 range.   PMH: Past Medical History:  Diagnosis Date  . Diabetes ([place])   . GERD (gastroesophageal reflux disease)   . Glaucoma   . Hyperlipidemia   . Hypertension   . Paroxysmal atrial fibrillation ([place])    Followed by Cardiology.    Surgical History: No past surgical history on file.  Home Medications:  Allergies as of 05/01/2017   No Known Allergies     Medication List        Accurate as of 05/01/17 11:59 PM. Always use your most recent med list.          aspirin 81 MG tablet Take 81 mg by  mouth daily.   ELIQUIS 2.5 MG Tabs tablet Generic drug:  apixaban TAKE 1 TABLET BY MOUTH TWICE A DAY.   esomeprazole 40 MG capsule Commonly known as:  NEXIUM Take 1 capsule (40 mg total) by mouth daily at 12 noon.   potassium chloride 10 MEQ tablet Commonly known as:  K-DUR Take 1 tablet (10 mEq total) by mouth daily.   rosuvastatin 5 MG tablet Commonly known as:  CRESTOR Take 1 tablet (5 mg total) by mouth daily.   tamsulosin 0.4 MG Caps capsule Commonly known as:  FLOMAX Take 1 capsule (0.4 mg total) by mouth daily.   traMADol 50 MG tablet Commonly known as:  ULTRAM Take by mouth every 12 (twelve) hours as needed.   valsartan-hydrochlorothiazide 160-12.5 MG tablet Commonly known as:  DIOVAN HCT Take 1 tablet by mouth daily.   Vitamin D (Ergocalciferol) 50000 units Caps capsule Commonly known as:  DRISDOL Take 1 capsule (50,000 Units total) by mouth once a week. For 12 weeks       Allergies: No Known Allergies  Family History: Family History  Problem Relation Age of Onset  . Bladder Cancer Neg Hx   . Kidney cancer Neg Hx   . AAA (abdominal aortic aneurysm) Neg Hx     Social History:  reports that he has quit smoking. He has never used smokeless tobacco. He reports that he does not drink  alcohol or use drugs.  ROS: UROLOGY Frequent Urination?: Yes Hard to postpone urination?: No Burning/pain with urination?: No Get up at night to urinate?: Yes Leakage of urine?: No Urine stream starts and stops?: Yes Trouble starting stream?: No Do you have to strain to urinate?: No Blood in urine?: No Urinary tract infection?: No Sexually transmitted disease?: No Injury to kidneys or bladder?: No Painful intercourse?: No Weak stream?: No Erection problems?: No Penile pain?: No  Gastrointestinal Nausea?: No Vomiting?: No Indigestion/heartburn?: No Diarrhea?: No Constipation?: No  Constitutional Fever: No Night sweats?: No Weight loss?: Yes Fatigue?:  No  Skin Skin rash/lesions?: No Itching?: No  Eyes Blurred vision?: No Double vision?: No  Ears/Nose/Throat Sore throat?: No Sinus problems?: No  Hematologic/Lymphatic Swollen glands?: No Easy bruising?: No  Cardiovascular Leg swelling?: No Chest pain?: No  Respiratory Cough?: No Shortness of breath?: No  Endocrine Excessive thirst?: No  Musculoskeletal Back pain?: No Joint pain?: No  Neurological Headaches?: No Dizziness?: No  Psychologic Depression?: No Anxiety?: No  Physical Exam: BP (!) 143/75   Pulse 84   Constitutional:  Alert and oriented, No acute distress. HEENT: [place] AT, moist mucus membranes.  Trachea midline, no masses. Cardiovascular: No clubbing, cyanosis, or edema. Respiratory: Normal respiratory effort, no increased work of breathing. Skin: No rashes, bruises or suspicious lesions. Neurologic: Grossly intact, no focal deficits, moving all 4 extremities. Psychiatric: Normal mood and affect.  Laboratory Data: Lab Results  Component Value Date   WBC 5.8 05/01/2016   HGB 15.2 05/01/2016   HCT 46.3 05/01/2016   MCV 87.2 05/01/2016   PLT 191 05/01/2016    Lab Results  Component Value Date   CREATININE 1.07 04/27/2017    Component     Latest Ref Rng & Units 01/12/2017  Prostate Specific Ag, Serum     0.0 - 4.0 ng/mL 35.7 (H)    Lab Results  Component Value Date   HGBA1C 6.8 (H) 04/27/2017    Urinalysis N/a  Pertinent Imaging:  Assessment & Plan:    1. Prostate cancer [facility]) The patient was counseled about the natural history of prostate cancer and the standard treatment options that are available for prostate cancer. It was explained to him how his age and life expectancy, clinical stage, Gleason score, and PSA affect his prognosis, the decision to proceed with additional staging studies, as well as how that information influences recommended treatment strategies. We discussed the roles for active surveillance, radiation  therapy, surgical therapy, androgen deprivation, as well as ablative therapy options for the treatment of prostate cancer as appropriate to his individual cancer situation. We discussed the risks and benefits of these options with regard to their impact on cancer control and also in terms of potential adverse events, complications, and impact on quality of life particularly related to urinary, bowel, and sexual function. The patient was encouraged to ask questions throughout the discussion today and all questions were answered to his stated satisfaction. In addition, the patient was providedwith and/or directed to appropriate resources and literature for further education about prostate cancer treatment options.  Given the presence of high risk disease and fairly quick PSA doubling time of only 4 months, I am suspicious that he may have micrometastatic disease which is quite aggressive.  We discussed primary treatment with androgen deprivation therapy including the risks and side effects of this medication at length today.  He was provided literature regarding this as well.  Although technically he is a candidate for local therapy in  the form of radiation, he would likely only benefit from this of his life expectancy is greater than 10 years and given his age and comorbidities, I feel that this is fairly unlikely.  He was offered referral to radiation oncology but declined after lengthy discussion about the risk benefits.  At this point in time, he would like to proceed with day [facility].  We will give him a 1 month induction course followed by 42-month Lupron injections.  Discussed importance of bone health on ADT, recommend 1000-1200 mg daily calcium suppliment and [phone] IU vit D daily.  Also encouraged weight being exercises and cardiovascular health.  - degarelix (FIRMAGON) injection 240 mg  2. Nocturia Continue Flomax Urinary symptoms may improve with treatment of his high risk prostate  cancer   Return in about 1 month (around 05/29/2017) for Lupron injection, PSA, testoterone.   Alice follow-up with MD in 4 months  [name], MD  [place] [address], [place], [place] [address] [phone]  I spent 25 min with this patient of which greater than 50% was spent in counseling and coordination of care with the patient.

## 2017-05-11 DIAGNOSIS — R001 Bradycardia, unspecified: Secondary | ICD-10-CM | POA: Diagnosis not present

## 2017-05-11 DIAGNOSIS — I10 Essential (primary) hypertension: Secondary | ICD-10-CM | POA: Diagnosis not present

## 2017-05-11 DIAGNOSIS — I480 Paroxysmal atrial fibrillation: Secondary | ICD-10-CM | POA: Diagnosis not present

## 2017-05-11 DIAGNOSIS — R0602 Shortness of breath: Secondary | ICD-10-CM | POA: Diagnosis not present

## 2017-05-11 DIAGNOSIS — I495 Sick sinus syndrome: Secondary | ICD-10-CM | POA: Diagnosis not present

## 2017-05-28 ENCOUNTER — Ambulatory Visit (INDEPENDENT_AMBULATORY_CARE_PROVIDER_SITE_OTHER): Payer: Medicare Other

## 2017-05-28 VITALS — BP 118/60 | HR 66 | Temp 98.9°F | Resp 12 | Ht 73.0 in | Wt 209.8 lb

## 2017-05-28 DIAGNOSIS — Z0000 Encounter for general adult medical examination without abnormal findings: Secondary | ICD-10-CM | POA: Diagnosis not present

## 2017-05-28 NOTE — Patient Instructions (Signed)
[name] , Thank you for taking time to come for your Medicare Wellness Visit. I appreciate your ongoing commitment to your health goals. Please review the following plan we discussed and let me know if I can assist you in the future.   Screening recommendations/referrals: Colorectal Screening: No longer required Lung Cancer Screening: You do not qualify for this screening Hepatitis C Screening: You do not qualify for this screening  Vision and Dental Exams: Recommended annual ophthalmology exams for early detection of glaucoma and other disorders of the eye Recommended annual dental exams for proper oral hygiene  Diabetic Exams: Recommended annual diabetic eye exams for early detection of retinopathy Recommended annual diabetic foot exams for early detection of peripheral neuropathy.  Diabetic Eye Exam: Please schedule an appointment with your ohthalmologist Diabetic Foot Exam: Up to date  Vaccinations: Influenza vaccine: Up to date Pneumococcal vaccine: Completed series Tdap vaccine: Declined. Please call your insurance company to determine your out of pocket expense. You may also receive this vaccine at your local pharmacy or Health Dept. Shingles vaccine: Please call your insurance company to determine your out of pocket expense for the Shingrix vaccine. You may also receive this vaccine at your local pharmacy or Health Dept.  Advanced directives: Advance directive discussed with you today. I have provided a copy for you to complete at home and have notarized. Once this is complete please bring a copy in to our office so we can scan it into your chart.  Conditions/risks identified: Recommend to drink at least 6-8 8oz glasses of water per day.  Next appointment: Please schedule your Annual Wellness Visit with your Nurse Health Advisor in one year.  Preventive Care 61 Years and Older, Male Preventive care refers to lifestyle choices and visits with your health care provider that  can promote health and wellness. What does preventive care include?  A yearly physical exam. This is also called an annual well check.  Dental exams once or twice a year.  Routine eye exams. Ask your health care provider how often you should have your eyes checked.  Personal lifestyle choices, including:  Daily care of your teeth and gums.  Regular physical activity.  Eating a healthy diet.  Avoiding tobacco and drug use.  Limiting alcohol use.  Practicing safe sex.  Taking low doses of aspirin every day.  Taking vitamin and mineral supplements as recommended by your health care provider. What happens during an annual well check? The services and screenings done by your health care provider during your annual well check will depend on your age, overall health, lifestyle risk factors, and family history of disease. Counseling  Your health care provider may ask you questions about your:  Alcohol use.  Tobacco use.  Drug use.  Emotional well-being.  Home and relationship well-being.  Sexual activity.  Eating habits.  History of falls.  Memory and ability to understand (cognition).  Work and work Statistician. Screening  You may have the following tests or measurements:  Height, weight, and BMI.  Blood pressure.  Lipid and cholesterol levels. These may be checked every 5 years, or more frequently if you are over 75 years old.  Skin check.  Lung cancer screening. You may have this screening every year starting at age 57 if you have a 30-pack-year history of smoking and currently smoke or have quit within the past 15 years.  Fecal occult blood test (FOBT) of the stool. You may have this test every year starting at age 48.  Flexible sigmoidoscopy or colonoscopy. You may have a sigmoidoscopy every 5 years or a colonoscopy every 10 years starting at age 4.  Prostate cancer screening. Recommendations will vary depending on your family history and other  risks.  Hepatitis C blood test.  Hepatitis B blood test.  Sexually transmitted disease (STD) testing.  Diabetes screening. This is done by checking your blood sugar (glucose) after you have not eaten for a while (fasting). You may have this done every 1-3 years.  Abdominal aortic aneurysm (AAA) screening. You may need this if you are a current or former smoker.  Osteoporosis. You may be screened starting at age 70 if you are at high risk. Talk with your health care provider about your test results, treatment options, and if necessary, the need for more tests. Vaccines  Your health care provider may recommend certain vaccines, such as:  Influenza vaccine. This is recommended every year.  Tetanus, diphtheria, and acellular pertussis (Tdap, Td) vaccine. You may need a Td booster every 10 years.  Zoster vaccine. You may need this after age 51.  Pneumococcal 13-valent conjugate (PCV13) vaccine. One dose is recommended after age 53.  Pneumococcal polysaccharide (PPSV23) vaccine. One dose is recommended after age 59. Talk to your health care provider about which screenings and vaccines you need and how often you need them. This information is not intended to replace advice given to you by your health care provider. Make sure you discuss any questions you have with your health care provider. Document Released: 01/19/2015 Document Revised: 09/12/2015 Document Reviewed: 10/24/2014 Elsevier Interactive Patient Education  2017 Reading Prevention in the Home Falls can cause injuries. They can happen to people of all ages. There are many things you can do to make your home safe and to help prevent falls. What can I do on the outside of my home?  Regularly fix the edges of walkways and driveways and fix any cracks.  Remove anything that might make you trip as you walk through a door, such as a raised step or threshold.  Trim any bushes or trees on the path to your home.  Use  bright outdoor lighting.  Clear any walking paths of anything that might make someone trip, such as rocks or tools.  Regularly check to see if handrails are loose or broken. Make sure that both sides of any steps have handrails.  Any raised decks and porches should have guardrails on the edges.  Have any leaves, snow, or ice cleared regularly.  Use sand or salt on walking paths during winter.  Clean up any spills in your garage right away. This includes oil or grease spills. What can I do in the bathroom?  Use night lights.  Install grab bars by the toilet and in the tub and shower. Do not use towel bars as grab bars.  Use non-skid mats or decals in the tub or shower.  If you need to sit down in the shower, use a plastic, non-slip stool.  Keep the floor dry. Clean up any water that spills on the floor as soon as it happens.  Remove soap buildup in the tub or shower regularly.  Attach bath mats securely with double-sided non-slip rug tape.  Do not have throw rugs and other things on the floor that can make you trip. What can I do in the bedroom?  Use night lights.  Make sure that you have a light by your bed that is easy to reach.  Do  not use any sheets or blankets that are too big for your bed. They should not hang down onto the floor.  Have a firm chair that has side arms. You can use this for support while you get dressed.  Do not have throw rugs and other things on the floor that can make you trip. What can I do in the kitchen?  Clean up any spills right away.  Avoid walking on wet floors.  Keep items that you use a lot in easy-to-reach places.  If you need to reach something above you, use a strong step stool that has a grab bar.  Keep electrical cords out of the way.  Do not use floor polish or wax that makes floors slippery. If you must use wax, use non-skid floor wax.  Do not have throw rugs and other things on the floor that can make you trip. What can  I do with my stairs?  Do not leave any items on the stairs.  Make sure that there are handrails on both sides of the stairs and use them. Fix handrails that are broken or loose. Make sure that handrails are as long as the stairways.  Check any carpeting to make sure that it is firmly attached to the stairs. Fix any carpet that is loose or worn.  Avoid having throw rugs at the top or bottom of the stairs. If you do have throw rugs, attach them to the floor with carpet tape.  Make sure that you have a light switch at the top of the stairs and the bottom of the stairs. If you do not have them, ask someone to add them for you. What else can I do to help prevent falls?  Wear shoes that:  Do not have high heels.  Have rubber bottoms.  Are comfortable and fit you well.  Are closed at the toe. Do not wear sandals.  If you use a stepladder:  Make sure that it is fully opened. Do not climb a closed stepladder.  Make sure that both sides of the stepladder are locked into place.  Ask someone to hold it for you, if possible.  Clearly mark and make sure that you can see:  Any grab bars or handrails.  First and last steps.  Where the edge of each step is.  Use tools that help you move around (mobility aids) if they are needed. These include:  Canes.  Walkers.  Scooters.  Crutches.  Turn on the lights when you go into a dark area. Replace any light bulbs as soon as they burn out.  Set up your furniture so you have a clear path. Avoid moving your furniture around.  If any of your floors are uneven, fix them.  If there are any pets around you, be aware of where they are.  Review your medicines with your doctor. Some medicines can make you feel dizzy. This can increase your chance of falling. Ask your doctor what other things that you can do to help prevent falls. This information is not intended to replace advice given to you by your health care provider. Make sure you  discuss any questions you have with your health care provider. Document Released: 10/19/2008 Document Revised: 05/31/2015 Document Reviewed: 01/27/2014 Elsevier Interactive Patient Education  2017 [employer].

## 2017-05-28 NOTE — Progress Notes (Addendum)
Subjective:   Charles Valencia is a 84 y.o. male who presents for Medicare Annual/Subsequent preventive examination.  Review of Systems:  N/A Cardiac Risk Factors include: advanced age (>57men, >22 women);diabetes mellitus;dyslipidemia;hypertension;male gender     Objective:    Vitals: BP 118/60 (BP Location: Left Arm, Patient Position: Sitting, Cuff Size: Normal)   Pulse 66   Temp 98.9 F (37.2 C) (Oral)   Resp 12   Ht 6\' 1"  (1.854 m)   Wt 209 lb 12.8 oz (95.2 kg)   SpO2 98%   BMI 27.68 kg/m   Body mass index is 27.68 kg/m.  Advanced Directives 05/28/2017 09/29/2016 09/16/2016 09/10/2016 07/02/2016 05/01/2016 01/02/2016  Does Patient Have a Medical Advance Directive? No No No No No No No  Would patient like information on creating a medical advance directive? Yes (MAU/Ambulatory/Procedural Areas - Information given) - - - - - -    Tobacco Social History   Tobacco Use  Smoking Status Former Smoker  . Types: Cigarettes  Smokeless Tobacco Never Used  Tobacco Comment   smoking cessation materials not required; poor historian, unable to recall smoking history     Counseling given: No Comment: smoking cessation materials not required; poor historian, unable to recall smoking history  Clinical Intake:  Pre-visit preparation completed: Yes  Pain : No/denies pain   BMI - recorded: 27.68 Nutritional Status: BMI 25 -29 Overweight Nutritional Risks: None  Nutrition Risk Assessment: Has the patient had any N/V/D within the last 2 months?  No Does the patient have any non-healing wounds?  No Has the patient had any unintentional weight loss or weight gain?  No  Is the patient diabetic?  Yes If diabetic, was a CBG obtained today?  No Did the patient bring in their glucometer from home?  No Comments: Pt states he does not monitor CBG's.   Diabetic Exams: Diabetic Eye Exam: Last completed 01/07/12. Dr. Sanda Klein completed referral on 04/27/17. At this time, pt states he has not  received a call re: his appt. Message sent to referral coordinator for follow up. Requested she call the pt with a status update.  UPDATE: Pt is scheduled to see Dr. Wallace Going on 06/18/17 @ 2:00pm. Pt made aware of appt date/time. Verbalized acceptance and understanding.  Diabetic Foot Exam: 04/27/17  How often do you need to have someone help you when you read instructions, pamphlets, or other written materials from your doctor or pharmacy?: 1 - Never  Interpreter Needed?: No  Information entered by :: AEversole, LPN  Hospitalizations/ED visits and surgeries occurring within the previous 12 months:  Within the previous 12 months, pt has not underwent any surgical procedures, has not been hospitalized for any conditions and has not been treated by an emergency room clinician.  Past Medical History:  Diagnosis Date  . Diabetes ([place])   . GERD (gastroesophageal reflux disease)   . Glaucoma   . Hyperlipidemia   . Hypertension   . Paroxysmal atrial fibrillation ([place])    Followed by Cardiology.   History reviewed. No pertinent surgical history. Family History  Problem Relation Age of Onset  . Bladder Cancer Neg Hx   . Kidney cancer Neg Hx   . AAA (abdominal aortic aneurysm) Neg Hx    Social History   Socioeconomic History  . Marital status: Single    Spouse name: Not on file  . Number of children: 0  . Years of education: Not on file  . Highest education level: 4th grade  Occupational History  .  Occupation: Retired  Scientific laboratory technician  . Financial resource strain: Not hard at all  . Food insecurity:    Worry: Never true    Inability: Never true  . Transportation needs:    Medical: No    Non-medical: No  Tobacco Use  . Smoking status: Former Smoker    Types: Cigarettes  . Smokeless tobacco: Never Used  . Tobacco comment: smoking cessation materials not required; poor historian, unable to recall smoking history  Substance and Sexual Activity  . Alcohol use: No     Alcohol/week: 0.0 oz  . Drug use: No  . Sexual activity: Not Currently  Lifestyle  . Physical activity:    Days per week: 7 days    Minutes per session: 30 min  . Stress: Not at all  Relationships  . Social connections:    Talks on phone: Patient refused    Gets together: Patient refused    Attends religious service: Patient refused    Active member of club or organization: Patient refused    Attends meetings of clubs or organizations: Patient refused    Relationship status: Patient refused  Other Topics Concern  . Not on file  Social History Narrative  . Not on file    Outpatient Encounter Medications as of 05/28/2017  Medication Sig  . aspirin 81 MG tablet Take 81 mg by mouth daily.  Marland Kitchen ELIQUIS 2.5 MG TABS tablet TAKE 1 TABLET BY MOUTH TWICE A DAY.  Marland Kitchen esomeprazole (NEXIUM) 40 MG capsule Take 1 capsule (40 mg total) by mouth daily at 12 noon.  . rosuvastatin (CRESTOR) 5 MG tablet Take 1 tablet (5 mg total) by mouth daily.  . tamsulosin (FLOMAX) 0.4 MG CAPS capsule Take 1 capsule (0.4 mg total) by mouth daily.  . valsartan-hydrochlorothiazide (DIOVAN HCT) 160-12.5 MG tablet Take 1 tablet by mouth daily.  . potassium chloride (K-DUR) 10 MEQ tablet Take 1 tablet (10 mEq total) by mouth daily.  . traMADol (ULTRAM) 50 MG tablet Take by mouth every 12 (twelve) hours as needed.  . Vitamin D, Ergocalciferol, (DRISDOL) 50000 units CAPS capsule Take 1 capsule (50,000 Units total) by mouth once a week. For 12 weeks   No facility-administered encounter medications on file as of 05/28/2017.     Activities of Daily Living In your present state of health, do you have any difficulty performing the following activities: 05/28/2017 04/27/2017  Hearing? N N  Comment denies hearing aids -  Vision? N N  Comment wears eyelasses -  Difficulty concentrating or making decisions? N N  Walking or climbing stairs? N N  Dressing or bathing? N N  Doing errands, shopping? N N  Preparing Food and eating ?  N -  Comment denies dentures -  Using the Toilet? N -  In the past six months, have you accidently leaked urine? N -  Do you have problems with loss of bowel control? N -  Managing your Medications? N -  Managing your Finances? N -  Housekeeping or managing your Housekeeping? N -  Some recent data might be hidden    Patient Care Team: Lada, Satira Anis, MD as PCP - General (Family Medicine) Yolonda Kida, MD as Consulting Physician (Cardiology) [name], MD as Consulting Physician (Urology)   Assessment:   This is a routine wellness examination for [facility].  Exercise Activities and Dietary recommendations Current Exercise Habits: Home exercise routine, Type of exercise: walking, Time (Minutes): 30, Frequency (Times/Week): 7, Weekly Exercise (Minutes/Week): 210, Intensity: Mild,  Exercise limited by: None identified  Goals    . DIET - INCREASE WATER INTAKE     Recommend to drink at least 6-8 8oz glasses of water per day.       Fall Risk Fall Risk  05/28/2017 04/27/2017 12/10/2016 09/16/2016 09/10/2016  Falls in the past year? No No No No No  Number falls in past yr: - - - - -  Injury with Fall? - - - - -  [place] for fall due to : Impaired vision;History of fall(s) - - - -  Risk for fall due to: Comment wears eyeglasses - - - -  Follow up - - - - -   [place]: Is your home free of loose throw rugs in walkways, pet beds, electrical cords, etc? Yes Is there adequate lighting in your home to reduce risk of falls?  Yes Are there stairs in or around your home WITH handrails? Yes  ASSISTIVE DEVICES UTILIZED TO PREVENT FALLS: Use of a cane, walker or w/c? No Grab bars in the bathroom? Yes  Shower chair or a place to sit while bathing? Yes An elevated toilet seat or a handicapped toilet? Yes  Timed Get Up and Go Performed: Yes. Pt ambulated 10 feet within 10 sec. Gait stead-fast and without the use of an assistive device. No  intervention required at this time. Fall risk prevention has been discussed.  Community Resource Referral:  [place] Referral not required at this time.   Depression Screen PHQ 2/9 Scores 05/28/2017 04/27/2017 12/10/2016 09/16/2016  PHQ - 2 Score 0 0 0 0  PHQ- 9 Score 0 - - -    Cognitive Function     6CIT Screen 05/28/2017  What Year? 0 points  What month? 3 points  What time? 3 points  Count back from 20 4 points  Months in reverse 4 points  Repeat phrase 10 points  Total Score 24    Immunization History  Administered Date(s) Administered  . Influenza, High Dose Seasonal PF 10/20/2014, 09/13/2015, 09/10/2016  . Pneumococcal Polysaccharide-23 09/10/2016    Qualifies for Shingles Vaccine? Yes. Due for Shingrix. Education has been provided regarding the importance of this vaccine. Pt has been advised to call his insurance company to determine his out of pocket expense. Advised he may also receive this vaccine at his local pharmacy or Health Dept. Verbalized acceptance and understanding.  Due for Tdap vaccine. Education has been provided regarding the importance of this vaccine. Pt has been advised he may receive this vaccine at his local pharmacy or Health Dept. Also advised to provide a copy of his vaccination record if he chooses to receive this vaccine at his local pharmacy. Verbalized acceptance and understanding.  Screening Tests Health Maintenance  Topic Date Due  . OPHTHALMOLOGY EXAM  01/08/1941  . TETANUS/TDAP  12/10/2017 (Originally 01/08/1950)  . INFLUENZA VACCINE  08/06/2017  . PNA vac Low Risk Adult (2 of 2 - PCV13) 09/10/2017  . HEMOGLOBIN A1C  10/27/2017  . FOOT EXAM  04/28/2018   Cancer Screenings: Lung: Low Dose CT Chest recommended if Age 20-80 years, 30 pack-year currently smoking OR have quit w/in 15years. Patient does not qualify. Colorectal: No longer required  Additional Screenings: Hepatitis C Screening: Does not qualify     Plan:  I have  personally reviewed and addressed the Medicare Annual Wellness questionnaire and have noted the following in the patient's chart:  A. Medical and social  history B. Use of alcohol, tobacco or illicit drugs  C. Current medications and supplements D. Functional ability and status E.  Nutritional status F.  Physical activity G. Advance directives H. List of other physicians I.  Hospitalizations, surgeries, and ER visits in previous 12 months J.  [place] such as hearing and vision if needed, cognitive and depression L. Referrals and appointments  In addition, I have reviewed and discussed with patient certain preventive protocols, quality metrics, and best practice recommendations. A written personalized care plan for preventive services as well as general preventive health recommendations were provided to patient.  See attached scanned questionnaire for additional information.   Signed,  Aleatha Borer, LPN Nurse Health Advisor

## 2017-06-09 ENCOUNTER — Other Ambulatory Visit: Payer: Self-pay | Admitting: Family Medicine

## 2017-06-09 DIAGNOSIS — C61 Malignant neoplasm of prostate: Secondary | ICD-10-CM

## 2017-06-10 ENCOUNTER — Other Ambulatory Visit: Payer: Medicare Other

## 2017-06-10 ENCOUNTER — Ambulatory Visit (INDEPENDENT_AMBULATORY_CARE_PROVIDER_SITE_OTHER): Payer: Medicare Other

## 2017-06-10 DIAGNOSIS — C61 Malignant neoplasm of prostate: Secondary | ICD-10-CM

## 2017-06-10 MED ORDER — LEUPROLIDE ACETATE (6 MONTH) 45 MG IM KIT
45.0000 mg | PACK | Freq: Once | INTRAMUSCULAR | Status: AC
  Administered 2017-06-10: 45 mg via INTRAMUSCULAR

## 2017-06-10 NOTE — Progress Notes (Signed)
Lupron IM Injection   Due to Prostate Cancer patient is present today for a Lupron Injection.  Medication: Lupron 6 month Dose: 45 mg  Location: right upper outer buttocks Lot: [id number] Exp: 10/01/2019  Patient tolerated well, no complications were noted  Performed by: Cristie Hem, CMA  Follow up: 4 months as scheduled w/ Dr. Erlene Quan

## 2017-06-11 LAB — PSA: PROSTATE SPECIFIC AG, SERUM: 0.8 ng/mL (ref 0.0–4.0)

## 2017-06-11 LAB — TESTOSTERONE: Testosterone: <3 ng/dL — ABNORMAL LOW (ref 264–916)

## 2017-06-18 DIAGNOSIS — H40003 Preglaucoma, unspecified, bilateral: Secondary | ICD-10-CM | POA: Diagnosis not present

## 2017-06-18 DIAGNOSIS — E119 Type 2 diabetes mellitus without complications: Secondary | ICD-10-CM | POA: Diagnosis not present

## 2017-06-18 LAB — HM DIABETES EYE EXAM

## 2017-06-19 ENCOUNTER — Encounter: Payer: Self-pay | Admitting: Family Medicine

## 2017-06-23 DIAGNOSIS — H5213 Myopia, bilateral: Secondary | ICD-10-CM | POA: Diagnosis not present

## 2017-09-01 ENCOUNTER — Other Ambulatory Visit: Payer: Self-pay

## 2017-09-01 MED ORDER — VALSARTAN-HYDROCHLOROTHIAZIDE 160-12.5 MG PO TABS: 1.0000 | ORAL_TABLET | Freq: Every day | ORAL | 1 refills | Status: DC

## 2017-09-01 NOTE — Telephone Encounter (Signed)
Hypertension medication request: Diovan HCT  Last office visit pertaining to hypertension: 05/28/17   BP Readings from Last 3 Encounters:  05/28/17 118/60  05/01/17 (!) 143/75  04/27/17 130/86    Lab Results  Component Value Date   CREATININE 1.07 04/27/2017   BUN 17 04/27/2017   NA 140 04/27/2017   K 3.5 04/27/2017   CL 100 04/27/2017   CO2 30 04/27/2017    Follow up on 10/27/2017

## 2017-10-12 ENCOUNTER — Other Ambulatory Visit: Payer: Self-pay

## 2017-10-12 DIAGNOSIS — E785 Hyperlipidemia, unspecified: Secondary | ICD-10-CM

## 2017-10-13 MED ORDER — ROSUVASTATIN CALCIUM 5 MG PO TABS: 5.0000 mg | ORAL_TABLET | Freq: Every day | ORAL | 1 refills | Status: DC

## 2017-10-13 MED ORDER — APIXABAN 2.5 MG PO TABS: 2.5000 mg | ORAL_TABLET | Freq: Two times a day (BID) | ORAL | 1 refills | Status: DC

## 2017-10-20 ENCOUNTER — Encounter: Payer: Self-pay | Admitting: Urology

## 2017-10-20 ENCOUNTER — Ambulatory Visit (INDEPENDENT_AMBULATORY_CARE_PROVIDER_SITE_OTHER): Payer: Medicare Other | Admitting: Urology

## 2017-10-20 VITALS — BP 167/99 | HR 50 | Resp 16 | Ht 73.0 in | Wt 216.4 lb

## 2017-10-20 DIAGNOSIS — C61 Malignant neoplasm of prostate: Secondary | ICD-10-CM

## 2017-10-20 DIAGNOSIS — H524 Presbyopia: Secondary | ICD-10-CM | POA: Diagnosis not present

## 2017-10-20 DIAGNOSIS — R351 Nocturia: Secondary | ICD-10-CM | POA: Diagnosis not present

## 2017-10-20 NOTE — Progress Notes (Signed)
10/20/2017 11:43 AM   Charles Valencia 1931/02/26 [id number]  Referring provider: Arnetha Courser, MD [address] [place], [place] [address]  Chief Complaint  Patient presents with  . Elevated PSA    HPI: 84 year old male with high risk prostate cancer who returns today for routine follow-up.  He received his last Lupron in 06/2017 which was well-tolerated.  He denies any issues with hot flashes.  He is taking calcium and vitamin D.  He is ambulating on a regular basis.  He denies any ongoing urinary symptoms.  He feels like his flow is good and is able to empty when he goes to the bathroom.  He has no complaints.  No gross hematuria or dysuria.  Most recent PSA 0.8 on 06/10/2017.  Testosterone was less than 3.  Prostate cancer history: He initially presented in 10/2016 with elevated PSA to 16.9.PSA was checked by his PCP on 09/10/2016 and markedly elevated to 16.9. Last known PSA in 2012 8.4.Rectal exam did show an enlarged gland which was massively enlarged with induration on the right lateral ridge without a discrete nodule although the exam was somewhat limited to habitus.  PSA was repeated on 01/12/2017 arose to 35.7.  Patient was extremely hesitant to undergo prostate biopsy due to concern for the enema thus this was delayed until 04/15/2017.  Ultimately prostate biopsy on this day revealed high risk prostate cancer, Gleason 4+5 up to 100% of the tissue including all biopsies right side of the biopsy in 2 of the left.  TRUS vol 53.  He underwent staging with CT scan and bone scan 04/2017 both of which are negative for any evidence of metastatic disease.  Notably, there is borderline prominence of the seminal vesicles but otherwise no lymphadenopathy.  He was started on diuretics followed by initiation of Lupron only given his age and comorbidities.  He did have baseline significant urinary symptoms on Flomax as well as nocturia x3-4 and borderline elevated PVR to the  200 range.   PMH: Past Medical History:  Diagnosis Date  . Diabetes ([place])   . GERD (gastroesophageal reflux disease)   . Glaucoma   . Hyperlipidemia   . Hypertension   . Paroxysmal atrial fibrillation ([place])    Followed by Cardiology.    Surgical History: History reviewed. No pertinent surgical history.  Home Medications:  Allergies as of 10/20/2017   No Known Allergies     Medication List        Accurate as of 10/20/17 11:43 AM. Always use your most recent med list.          apixaban 2.5 MG Tabs tablet Commonly known as:  ELIQUIS Take 1 tablet (2.5 mg total) by mouth 2 (two) times daily.   aspirin 81 MG tablet Take 81 mg by mouth daily.   esomeprazole 40 MG capsule Commonly known as:  NEXIUM Take 1 capsule (40 mg total) by mouth daily at 12 noon.   potassium chloride 10 MEQ tablet Commonly known as:  K-DUR Take 1 tablet (10 mEq total) by mouth daily.   rosuvastatin 5 MG tablet Commonly known as:  CRESTOR Take 1 tablet (5 mg total) by mouth daily.   tamsulosin 0.4 MG Caps capsule Commonly known as:  FLOMAX Take 1 capsule (0.4 mg total) by mouth daily.   traMADol 50 MG tablet Commonly known as:  ULTRAM Take by mouth every 12 (twelve) hours as needed.   valsartan-hydrochlorothiazide 160-12.5 MG tablet Commonly known as:  DIOVAN-HCT Take 1 tablet  by mouth daily.   Vitamin D (Ergocalciferol) 50000 units Caps capsule Commonly known as:  DRISDOL Take 1 capsule (50,000 Units total) by mouth once a week. For 12 weeks       Allergies: No Known Allergies  Family History: Family History  Problem Relation Age of Onset  . Bladder Cancer Neg Hx   . Kidney cancer Neg Hx   . AAA (abdominal aortic aneurysm) Neg Hx     Social History:  reports that he has quit smoking. His smoking use included cigarettes. He has never used smokeless tobacco. He reports that he does not drink alcohol or use drugs.  ROS: UROLOGY Frequent Urination?: No Hard to postpone  urination?: No Burning/pain with urination?: No Get up at night to urinate?: No Leakage of urine?: No Urine stream starts and stops?: No Trouble starting stream?: No Do you have to strain to urinate?: No Blood in urine?: No Urinary tract infection?: No Sexually transmitted disease?: No Injury to kidneys or bladder?: No Painful intercourse?: No Weak stream?: No Erection problems?: No Penile pain?: No  Gastrointestinal Nausea?: No Vomiting?: No Indigestion/heartburn?: No Diarrhea?: No Constipation?: No  Constitutional Fever: No Night sweats?: No Weight loss?: No Fatigue?: No  Skin Skin rash/lesions?: No Itching?: No  Eyes Blurred vision?: No Double vision?: No  Ears/Nose/Throat Sore throat?: No Sinus problems?: No  Hematologic/Lymphatic Swollen glands?: No Easy bruising?: No  Cardiovascular Leg swelling?: No Chest pain?: No  Respiratory Cough?: No Shortness of breath?: No  Endocrine Excessive thirst?: No  Musculoskeletal Back pain?: No Joint pain?: No  Neurological Headaches?: No Dizziness?: No  Psychologic Depression?: No Anxiety?: No  Physical Exam: BP (!) 167/99   Pulse (!) 50   Resp 16   Ht 6\' 1"  (1.854 m)   Wt 216 lb 6.4 oz (98.2 kg)   BMI 28.55 kg/m   Constitutional:  Alert and oriented, No acute distress. HEENT: [place] AT, moist mucus membranes.  Trachea midline, no masses. Cardiovascular: No clubbing, cyanosis, or edema. Respiratory: Normal respiratory effort, no increased work of breathing. Skin: No rashes, bruises or suspicious lesions. Neurologic: Grossly intact, no focal deficits, moving all 4 extremities. Psychiatric: Normal mood and affect.  Laboratory Data: Lab Results  Component Value Date   WBC 5.8 05/01/2016   HGB 15.2 05/01/2016   HCT 46.3 05/01/2016   MCV 87.2 05/01/2016   PLT 191 05/01/2016    Lab Results  Component Value Date   CREATININE 1.07 04/27/2017    Lab Results  Component Value Date    TESTOSTERONE <3 (L) 06/10/2017    Lab Results  Component Value Date   HGBA1C 6.8 (H) 04/27/2017    Assessment & Plan:    1. Prostate cancer ([place]) High risk prostate cancer dx 04/2017, nonmetastatic  PSA today Given his age and comorbidities, local therapy was deferred and he is on ADT only We will monitor him closely given his rapid rise in PSA, high risk for development of castrate resistant disease, low threshold to add oral agent  - PSA  2. Nocturia Symptoms improving on ADT/ flomax Continue flomax   Return for 12/09/2017 or after for lupron 6 month (nurse visit) then see me in 8 months for PSA/ lupron.  [name], MD  [facility] [address] [place], [place] [address] [phone]

## 2017-10-21 LAB — PSA: Prostate Specific Ag, Serum: <0.1 ng/mL (ref 0.0–4.0)

## 2017-10-22 ENCOUNTER — Telehealth: Payer: Self-pay | Admitting: Family Medicine

## 2017-10-22 NOTE — Telephone Encounter (Signed)
-----   Message from [name], MD sent at 10/21/2017  8:17 AM EDT ----- PSA is excellent.  Great news.  [name], MD

## 2017-10-22 NOTE — Telephone Encounter (Signed)
Unable to leave message, no machine

## 2017-10-27 ENCOUNTER — Ambulatory Visit (INDEPENDENT_AMBULATORY_CARE_PROVIDER_SITE_OTHER): Payer: Medicare Other | Admitting: Family Medicine

## 2017-10-27 ENCOUNTER — Encounter: Payer: Self-pay | Admitting: Family Medicine

## 2017-10-27 VITALS — BP 130/76 | HR 89 | Temp 97.4°F | Ht 73.0 in | Wt 216.0 lb

## 2017-10-27 DIAGNOSIS — Z5181 Encounter for therapeutic drug level monitoring: Secondary | ICD-10-CM

## 2017-10-27 DIAGNOSIS — E782 Mixed hyperlipidemia: Secondary | ICD-10-CM | POA: Diagnosis not present

## 2017-10-27 DIAGNOSIS — L602 Onychogryphosis: Secondary | ICD-10-CM | POA: Diagnosis not present

## 2017-10-27 DIAGNOSIS — I999 Unspecified disorder of circulatory system: Secondary | ICD-10-CM

## 2017-10-27 DIAGNOSIS — E119 Type 2 diabetes mellitus without complications: Secondary | ICD-10-CM

## 2017-10-27 DIAGNOSIS — Z23 Encounter for immunization: Secondary | ICD-10-CM | POA: Diagnosis not present

## 2017-10-27 DIAGNOSIS — I480 Paroxysmal atrial fibrillation: Secondary | ICD-10-CM

## 2017-10-27 DIAGNOSIS — E876 Hypokalemia: Secondary | ICD-10-CM

## 2017-10-27 NOTE — Patient Instructions (Signed)
Please do see your eye doctor regularly, and have your eyes examined every year (or more often per his or her recommendation) Check your feet every night and let me know right away of any sores, infections, numbness, etc. Try to limit sweets, white bread, white rice, white potatoes It is okay with me for you to not check your fingerstick blood sugars (per [employer] of Endocrinology Best Practices), unless you are interested and feel it would be helpful for you Try to limit saturated fats in your diet (bologna, hot dogs, barbeque, cheeseburgers, hamburgers, steak, bacon, sausage, cheese, etc.) and get more fresh fruits, vegetables, and whole grains

## 2017-10-27 NOTE — Progress Notes (Signed)
BP 130/76   Pulse 89   Temp (!) 97.4 F (36.3 C) (Oral)   Ht 6\' 1"  (1.854 m)   Wt 216 lb (98 kg)   SpO2 99%   BMI 28.50 kg/m    Subjective:    Patient ID: Charles Valencia, male    DOB: 07-24-1931, 84 y.o.   MRN: [id number]  HPI: Charles Valencia is a 84 y.o. male  Chief Complaint  Patient presents with  . Follow-up    HPI Here for f/u  Type 2 diabetes; not checking his FSBS; no problems with dry mouth or blurred vision; long toenails Urine microalbumin:Cr was elevated in April   Lab Results  Component Value Date   HGBA1C 6.9 (H) 10/27/2017   High cholesterol; on statin; no muscle aches  Paroxysmal atrial fib; seeing cardiologist; on blood thinner; no bleeding from nose, gums, or in stool  Hx of prostate cancer; PSA was >35 nine months ago; last PSA was undetectable, monitored by urologist; no blood in the urine  Depression screen [facility] 2/9 10/27/2017 05/28/2017 04/27/2017 12/10/2016 09/16/2016  Decreased Interest 0 0 0 0 0  Down, Depressed, Hopeless 0 0 0 0 0  PHQ - 2 Score 0 0 0 0 0  Altered sleeping 0 0 - - -  Tired, decreased energy 0 0 - - -  Change in appetite 0 0 - - -  Feeling bad or failure about yourself  0 0 - - -  Trouble concentrating 0 0 - - -  Moving slowly or fidgety/restless 0 0 - - -  Suicidal thoughts 0 0 - - -  PHQ-9 Score 0 0 - - -  Difficult doing work/chores Not difficult at all Not difficult at all - - -   Fall Risk  10/27/2017 05/28/2017 04/27/2017 12/10/2016 09/16/2016  Falls in the past year? No No No No No  Number falls in past yr: - - - - -  Injury with Fall? - - - - -  [place] for fall due to : - Impaired vision;History of fall(s) - - -  Risk for fall due to: Comment - wears eyeglasses - - -  Follow up - - - - -    Relevant past medical, surgical, family and social history reviewed Past Medical History:  Diagnosis Date  . Diabetes ([place])   . GERD (gastroesophageal reflux disease)   . Glaucoma   . Hyperlipidemia   .  Hypertension   . Paroxysmal atrial fibrillation ([place])    Followed by Cardiology.   History reviewed. No pertinent surgical history. Family History  Problem Relation Age of Onset  . Bladder Cancer Neg Hx   . Kidney cancer Neg Hx   . AAA (abdominal aortic aneurysm) Neg Hx    Social History   Tobacco Use  . Smoking status: Former Smoker    Types: Cigarettes  . Smokeless tobacco: Never Used  . Tobacco comment: smoking cessation materials not required; poor historian, unable to recall smoking history  Substance Use Topics  . Alcohol use: No    Alcohol/week: 0.0 standard drinks  . Drug use: No     [facility] Visit from 10/27/2017 in [facility]  AUDIT-C Score  0      Interim medical history since last visit reviewed. Allergies and medications reviewed  Review of Systems Per HPI unless specifically indicated above     Objective:    BP 130/76   Pulse 89  Temp (!) 97.4 F (36.3 C) (Oral)   Ht 6\' 1"  (1.854 m)   Wt 216 lb (98 kg)   SpO2 99%   BMI 28.50 kg/m   Wt Readings from Last 3 Encounters:  10/27/17 216 lb (98 kg)  10/20/17 216 lb 6.4 oz (98.2 kg)  05/28/17 209 lb 12.8 oz (95.2 kg)    Physical Exam  Constitutional: He appears well-developed and well-nourished. No distress.  HENT:  Head: Normocephalic and atraumatic.  Eyes: EOM are normal. No scleral icterus.  Neck: No thyromegaly present.  Cardiovascular: Normal rate.  Pulmonary/Chest: Effort normal and breath sounds normal.  Abdominal: Soft. Bowel sounds are normal. He exhibits no distension.  Musculoskeletal: He exhibits no edema.  Neurological: Coordination normal.  Skin: Skin is warm and dry. No pallor.  Psychiatric: He has a normal mood and affect. His behavior is normal. Judgment and thought content normal.   Diabetic Foot Form - Detailed   Diabetic Foot Exam - detailed Diabetic Foot exam was performed with the following findings:  Yes 10/27/2017 12:48 PM  Visual Foot Exam  completed.:  Yes  Pulse Foot Exam completed.:  Yes  Right Dorsalis Pedis:  Present, Diminished Left Dorsalis Pedis:  Present, Diminished  Sensory Foot Exam Completed.:  Yes Semmes-Weinstein Monofilament Test R Site 1-Great Toe:  Pos L Site 1-Great Toe:  Pos           Assessment & Plan:   Problem List Items Addressed This Visit      Cardiovascular and Mediastinum   Atrial fibrillation (HCC) (Chronic)    Paroxysmal; monitored by cardiologist; on anticoagulation        Endocrine   Type 2 diabetes mellitus ([place]) - Primary (Chronic)    Only check FSBS if helpful to patient; foot exam by MD; monitor labs      Relevant Orders   Ambulatory referral to Podiatry   Microalbumin / creatinine urine ratio (Completed)   Lipid panel (Completed)   Hemoglobin A1c (Completed)     Other   Hypokalemia due to loss of potassium   Hyperlipidemia (Chronic)    Continue statin; no side effects; limit sat fats; check lipids, goal LDL less than 70       Other Visit Diagnoses    Long toenail       Relevant Orders   Ambulatory referral to Podiatry   Need for influenza vaccination       Relevant Orders   Flu vaccine HIGH DOSE PF (Fluzone High dose) (Completed)   Medication monitoring encounter       Relevant Orders   COMPLETE METABOLIC PANEL WITH GFR (Completed)   Impaired circulation of left leg       Relevant Orders   VAS [place] ABI WITH/WO TBI   Impaired circulation of right leg       Relevant Orders   VAS [place] ABI WITH/WO TBI       Follow up plan: Return in about 6 months (around 04/28/2018) for follow-up visit with Dr. Sanda Klein.  An after-visit summary was printed and given to the patient at [place].  Please see the patient instructions which may contain other information and recommendations beyond what is mentioned above in the assessment and plan.  No orders of the defined types were placed in this encounter.   Orders Placed This Encounter  Procedures  . Flu vaccine HIGH DOSE PF  (Fluzone High dose)  . Microalbumin / creatinine urine ratio  . Lipid panel  . Hemoglobin A1c  .  COMPLETE METABOLIC PANEL WITH GFR  . Ambulatory referral to Podiatry

## 2017-10-28 LAB — COMPLETE METABOLIC PANEL WITH GFR
AG RATIO: 1.5 (calc) (ref 1.0–2.5)
ALBUMIN MSPROF: 4.3 g/dL (ref 3.6–5.1)
ALT: 21 U/L (ref 9–46)
AST: 26 U/L (ref 10–35)
Alkaline phosphatase (APISO): 78 U/L (ref 40–115)
BILIRUBIN TOTAL: 0.9 mg/dL (ref 0.2–1.2)
BUN: 19 mg/dL (ref 7–25)
CHLORIDE: 99 mmol/L (ref 98–110)
CO2: 31 mmol/L (ref 20–32)
Calcium: 9.7 mg/dL (ref 8.6–10.3)
Creat: 0.96 mg/dL (ref 0.70–1.11)
GFR, EST AFRICAN AMERICAN: 84 mL/min/{1.73_m2} (ref >=60)
GFR, Est Non African American: 72 mL/min/{1.73_m2} (ref >=60)
GLOBULIN: 2.8 g/dL (ref 1.9–3.7)
Glucose, Bld: 97 mg/dL (ref 65–99)
Potassium: 3.5 mmol/L (ref 3.5–5.3)
Sodium: 140 mmol/L (ref 135–146)
TOTAL PROTEIN: 7.1 g/dL (ref 6.1–8.1)

## 2017-10-28 LAB — LIPID PANEL
CHOLESTEROL: 109 mg/dL (ref <200)
HDL: 46 mg/dL (ref >40)
LDL Cholesterol (Calc): 44 mg/dL (calc)
Non-HDL Cholesterol (Calc): 63 mg/dL (calc) (ref <130)
TRIGLYCERIDES: 103 mg/dL (ref <150)
Total CHOL/HDL Ratio: 2.4 (calc) (ref <5.0)

## 2017-10-28 LAB — HEMOGLOBIN A1C
EAG (MMOL/L): 8.4 (calc)
HEMOGLOBIN A1C: 6.9 %{Hb} — AB (ref <5.7)
Mean Plasma Glucose: 151 (calc)

## 2017-10-28 LAB — MICROALBUMIN / CREATININE URINE RATIO
Creatinine, Urine: 90 mg/dL (ref 20–320)
Microalb Creat Ratio: 127 mcg/mg creat — ABNORMAL HIGH (ref <30)
Microalb, Ur: 11.4 mg/dL

## 2017-10-28 NOTE — Telephone Encounter (Signed)
Patient notified

## 2017-11-06 ENCOUNTER — Other Ambulatory Visit: Payer: Self-pay

## 2017-11-06 DIAGNOSIS — K219 Gastro-esophageal reflux disease without esophagitis: Secondary | ICD-10-CM

## 2017-11-06 MED ORDER — ESOMEPRAZOLE MAGNESIUM 40 MG PO CPDR: 40.0000 mg | DELAYED_RELEASE_CAPSULE | Freq: Every day | ORAL | 1 refills | Status: DC

## 2017-11-08 NOTE — Assessment & Plan Note (Signed)
Paroxysmal; monitored by cardiologist; on anticoagulation

## 2017-11-08 NOTE — Assessment & Plan Note (Signed)
Only check FSBS if helpful to patient; foot exam by MD; monitor labs

## 2017-11-08 NOTE — Assessment & Plan Note (Signed)
Continue statin; no side effects; limit sat fats; check lipids, goal LDL less than 70

## 2017-11-11 DIAGNOSIS — I480 Paroxysmal atrial fibrillation: Secondary | ICD-10-CM | POA: Diagnosis not present

## 2017-11-11 DIAGNOSIS — I495 Sick sinus syndrome: Secondary | ICD-10-CM | POA: Diagnosis not present

## 2017-11-11 DIAGNOSIS — R001 Bradycardia, unspecified: Secondary | ICD-10-CM | POA: Diagnosis not present

## 2017-11-11 DIAGNOSIS — R0602 Shortness of breath: Secondary | ICD-10-CM | POA: Diagnosis not present

## 2017-11-11 DIAGNOSIS — I10 Essential (primary) hypertension: Secondary | ICD-10-CM | POA: Diagnosis not present

## 2017-11-16 ENCOUNTER — Ambulatory Visit (INDEPENDENT_AMBULATORY_CARE_PROVIDER_SITE_OTHER): Payer: Medicare Other | Admitting: Podiatry

## 2017-11-16 ENCOUNTER — Encounter: Payer: Self-pay | Admitting: Podiatry

## 2017-11-16 VITALS — BP 159/89 | HR 62

## 2017-11-16 DIAGNOSIS — E119 Type 2 diabetes mellitus without complications: Secondary | ICD-10-CM | POA: Diagnosis not present

## 2017-11-16 DIAGNOSIS — B351 Tinea unguium: Secondary | ICD-10-CM

## 2017-11-16 DIAGNOSIS — M79675 Pain in left toe(s): Secondary | ICD-10-CM | POA: Diagnosis not present

## 2017-11-16 DIAGNOSIS — M79674 Pain in right toe(s): Secondary | ICD-10-CM

## 2017-11-16 DIAGNOSIS — D689 Coagulation defect, unspecified: Secondary | ICD-10-CM | POA: Diagnosis not present

## 2017-11-16 NOTE — Progress Notes (Signed)
This patient presents to the office with chief complaint of long thick nails and diabetic feet.  This patient  says there  is  no pain and discomfort in their feet.  This patient says there are long thick painful nails.  These nails are painful walking and wearing shoes.  Patient has no history of infection or drainage from both feet.  Patient is unable to  self treat his own nails . This patient presents  to the office today for treatment of the  long nails and a foot evaluation due to history of  Diabetes. Patient is taking eliquiss.  General Appearance  Alert, conversant and in no acute stress.  Vascular  Dorsalis pedis and posterior tibial  pulses are palpable  bilaterally.  Capillary return is within normal limits  bilaterally. Temperature is within normal limits  bilaterally.  Neurologic  Senn-Weinstein monofilament wire test within normal limits  bilaterally. Muscle power within normal limits bilaterally.  Nails Thick disfigured discolored nails with subungual debris  from hallux to fifth toes bilaterally. No evidence of bacterial infection or drainage bilaterally.  Orthopedic  No limitations of motion of motion feet .  No crepitus or effusions noted.  No bony pathology or digital deformities noted.  Skin  normotropic skin with no porokeratosis noted bilaterally.  No signs of infections or ulcers noted.     Onychomycosis  Diabetes with no foot complications  IE  Debride nails x 10.  A diabetic foot exam was performed and there is no evidence of any vascular or neurologic pathology.   RTC 3 months.   Gardiner Barefoot DPM

## 2017-12-16 ENCOUNTER — Ambulatory Visit (INDEPENDENT_AMBULATORY_CARE_PROVIDER_SITE_OTHER): Payer: Medicare Other | Admitting: Family Medicine

## 2017-12-16 VITALS — BP 145/78 | HR 76 | Ht 73.0 in | Wt 218.0 lb

## 2017-12-16 DIAGNOSIS — C61 Malignant neoplasm of prostate: Secondary | ICD-10-CM

## 2017-12-16 MED ORDER — LEUPROLIDE ACETATE (6 MONTH) 45 MG IM KIT
45.0000 mg | PACK | Freq: Once | INTRAMUSCULAR | Status: AC
  Administered 2017-12-16: 45 mg via INTRAMUSCULAR

## 2017-12-16 NOTE — Progress Notes (Signed)
Lupron IM Injection   Due to Prostate Cancer patient is present today for a Lupron Injection.  Medication: Lupron 6 month Dose: 45 mg  Location: right upper outer buttocks Lot: [id number] Exp: 03/04/20  Patient tolerated well, no complications were noted  Performed by: Elberta Leatherwood, CMA  Follow up: 6 months

## 2018-01-08 ENCOUNTER — Other Ambulatory Visit: Payer: Self-pay | Admitting: Nurse Practitioner

## 2018-01-08 DIAGNOSIS — E785 Hyperlipidemia, unspecified: Secondary | ICD-10-CM

## 2018-01-08 NOTE — Telephone Encounter (Signed)
Neither the Crestor nor the Diovan-HCT should be needed right now Rxs were requested to go to [facility], the same pharmacy where they were sent in August and October Please resolve with pharmacy

## 2018-01-25 ENCOUNTER — Ambulatory Visit: Payer: Medicare Other | Admitting: Podiatry

## 2018-02-08 ENCOUNTER — Encounter: Payer: Self-pay | Admitting: Podiatry

## 2018-02-08 ENCOUNTER — Ambulatory Visit (INDEPENDENT_AMBULATORY_CARE_PROVIDER_SITE_OTHER): Payer: Medicare Other | Admitting: Podiatry

## 2018-02-08 DIAGNOSIS — M79674 Pain in right toe(s): Secondary | ICD-10-CM

## 2018-02-08 DIAGNOSIS — B351 Tinea unguium: Secondary | ICD-10-CM

## 2018-02-08 DIAGNOSIS — E119 Type 2 diabetes mellitus without complications: Secondary | ICD-10-CM

## 2018-02-08 DIAGNOSIS — M79675 Pain in left toe(s): Secondary | ICD-10-CM

## 2018-02-08 DIAGNOSIS — D689 Coagulation defect, unspecified: Secondary | ICD-10-CM

## 2018-02-08 NOTE — Progress Notes (Addendum)
Complaint:  Visit Type: Patient returns to my office for continued preventative foot care services. Complaint: Patient states" my nails have grown long and thick and become painful to walk and wear shoes" Patient has been diagnosed with DM with no foot complications. The patient presents for preventative foot care services. No changes to ROS.  Patient is taking eliquiss.  Podiatric Exam: Vascular: dorsalis pedis and posterior tibial pulses are palpable bilateral. Capillary return is immediate. Temperature gradient is WNL. Skin turgor WNL  Sensorium: Normal Semmes Weinstein monofilament test. Normal tactile sensation bilaterally. Nail Exam: Pt has thick disfigured discolored nails with subungual debris noted bilateral entire nail hallux through fifth toenails Ulcer Exam: There is no evidence of ulcer or pre-ulcerative changes or infection. Orthopedic Exam: Muscle tone and strength are WNL. No limitations in general ROM. No crepitus or effusions noted. Foot type and digits show no abnormalities. Bony prominences are unremarkable. Skin: No Porokeratosis. No infection or ulcers  Diagnosis:  Onychomycosis, , Pain in right toe, pain in left toes  Treatment & Plan Procedures and Treatment: Consent by patient was obtained for treatment procedures.   Debridement of mycotic and hypertrophic toenails, 1 through 5 bilateral and clearing of subungual debris. No ulceration, no infection noted.  Return Visit-Office Procedure: Patient instructed to return to the office for a follow up visit 4 months for continued evaluation and treatment.    Taffy Delconte DPM 

## 2018-03-09 ENCOUNTER — Other Ambulatory Visit: Payer: Self-pay | Admitting: Nurse Practitioner

## 2018-03-09 NOTE — Telephone Encounter (Signed)
Reviewed last Cr, K+, and BP Rx approved

## 2018-04-09 ENCOUNTER — Other Ambulatory Visit: Payer: Self-pay | Admitting: Nurse Practitioner

## 2018-04-09 DIAGNOSIS — E785 Hyperlipidemia, unspecified: Secondary | ICD-10-CM

## 2018-04-09 NOTE — Telephone Encounter (Signed)
Lab Results  Component Value Date   ALT 21 10/27/2017   Lab Results  Component Value Date   CHOL 109 10/27/2017   HDL 46 10/27/2017   LDLCALC 44 10/27/2017   TRIG 103 10/27/2017   CHOLHDL 2.4 10/27/2017

## 2018-04-28 ENCOUNTER — Encounter: Payer: Self-pay | Admitting: Family Medicine

## 2018-04-28 ENCOUNTER — Other Ambulatory Visit: Payer: Self-pay

## 2018-04-28 ENCOUNTER — Ambulatory Visit (INDEPENDENT_AMBULATORY_CARE_PROVIDER_SITE_OTHER): Payer: Medicare Other | Admitting: Family Medicine

## 2018-04-28 NOTE — Progress Notes (Signed)
   There were no vitals taken for this visit.   Subjective:    Patient ID: Charles Valencia, male    DOB: [birth year], 85 y.o.   MRN: [id number]  HPI: Charles Valencia is a 85 y.o. male  Chief Complaint  Patient presents with  . Follow-up    HPI Virtual Visit via Telephone/Video Note   I connected with the patient via:  telephone I verified that I am speaking with the correct person using two identifiers.  Call started: 10:19 am Call terminated: 10:24 am  Provider location: office Patient location: in the drive thru; they are in a car and the woman driver speaking on the phone during the visit said she was going to drive and it wouldn't bother her; I told her I was not comfortable having a doctor visit in a drive thru and while she is driving because I would like his full attention and don't want to distract the driver of the car; I asked if she would at least park for the duration of the visit and she said that the phone was not going to distract her driving; I declined to do a virtual visit while she is driving and explained I would have staff call back to reschedule at another time when they will not be driving in the car Additional participants: woman, I do not know I asked office manager to reschedule when patient can be at home --------------------------------------------------------------     Fall Risk  04/28/2018 10/27/2017 05/28/2017 04/27/2017 12/10/2016  Falls in the past year? 0 No No No No  Number falls in past yr: 0 - - - -  Injury with Fall? 0 - - - -  Comment - - - - -  Risk for fall due to : - - Impaired vision;History of fall(s) - -  Risk for fall due to: Comment - - wears eyeglasses - -  Follow up - - - - -    Relevant past medical, surgical, family and social history reviewed  History reviewed. No pertinent surgical history.    [facility] Visit from 04/28/2018 in [facility]  AUDIT-C Score  0      Review of Systems Per HPI unless  specifically indicated above     Objective:    There were no vitals taken for this visit.  Wt Readings from Last 3 Encounters:  12/16/17 218 lb (98.9 kg)  10/27/17 216 lb (98 kg)  10/20/17 216 lb 6.4 oz (98.2 kg)    Physical Exam      Assessment & Plan:   Problem List Items Addressed This Visit    None    Visit Diagnoses    Erroneous encounter - disregard    -  Primary       Follow up plan: No follow-ups on file.

## 2018-04-29 ENCOUNTER — Encounter: Payer: Self-pay | Admitting: Nurse Practitioner

## 2018-04-29 ENCOUNTER — Encounter: Payer: Self-pay | Admitting: Family Medicine

## 2018-04-29 ENCOUNTER — Ambulatory Visit (INDEPENDENT_AMBULATORY_CARE_PROVIDER_SITE_OTHER): Payer: Medicare Other | Admitting: Family Medicine

## 2018-04-29 ENCOUNTER — Other Ambulatory Visit: Payer: Self-pay

## 2018-04-30 NOTE — Progress Notes (Signed)
Patient was not seen.

## 2018-05-07 ENCOUNTER — Ambulatory Visit (INDEPENDENT_AMBULATORY_CARE_PROVIDER_SITE_OTHER): Payer: Medicare Other | Admitting: Nurse Practitioner

## 2018-05-07 ENCOUNTER — Other Ambulatory Visit: Payer: Self-pay

## 2018-05-07 ENCOUNTER — Encounter: Payer: Self-pay | Admitting: Nurse Practitioner

## 2018-05-07 ENCOUNTER — Ambulatory Visit: Payer: Medicare Other | Admitting: Family Medicine

## 2018-05-07 DIAGNOSIS — R351 Nocturia: Secondary | ICD-10-CM

## 2018-05-07 DIAGNOSIS — I480 Paroxysmal atrial fibrillation: Secondary | ICD-10-CM | POA: Diagnosis not present

## 2018-05-07 DIAGNOSIS — E782 Mixed hyperlipidemia: Secondary | ICD-10-CM

## 2018-05-07 DIAGNOSIS — I10 Essential (primary) hypertension: Secondary | ICD-10-CM | POA: Diagnosis not present

## 2018-05-07 DIAGNOSIS — K219 Gastro-esophageal reflux disease without esophagitis: Secondary | ICD-10-CM | POA: Diagnosis not present

## 2018-05-07 DIAGNOSIS — E119 Type 2 diabetes mellitus without complications: Secondary | ICD-10-CM

## 2018-05-07 DIAGNOSIS — N401 Enlarged prostate with lower urinary tract symptoms: Secondary | ICD-10-CM

## 2018-05-07 MED ORDER — ESOMEPRAZOLE MAGNESIUM 40 MG PO CPDR
40.0000 mg | DELAYED_RELEASE_CAPSULE | Freq: Every day | ORAL | 1 refills | Status: DC
Start: 2018-05-07 — End: 2018-11-08

## 2018-05-07 MED ORDER — APIXABAN 2.5 MG PO TABS: 2.5000 mg | ORAL_TABLET | Freq: Two times a day (BID) | ORAL | 1 refills | Status: DC

## 2018-05-07 NOTE — Progress Notes (Signed)
Virtual Visit via Telephone Note  I connected with Charles Valencia on 05/07/18 at 10:20 AM EDT by telephone and verified that I am speaking with the correct person using two identifiers.   Staff discussed the limitations, risks, security and privacy concerns of performing an evaluation and management service by telephone and the availability of in person appointments. Staff also discussed with the patient that there may be a patient responsible charge related to this service. The patient expressed understanding and agreed to proceed.  Patients location: parking lot  My location: home office  Other people in meeting: Joycelyn Schmid (Niece)    HPI Hypertension  Patient takes valsartan-HCTZ 160-12.5mg  daily. Denies chest pain, dizziness, lightheadedness or headaches BP Readings from Last 3 Encounters:  12/16/17 (!) 145/78  11/16/17 (!) 159/89  10/27/17 130/76    Hyperlipidemia Rosuvastatin 5mg  nightly No myalgias States eats a lot of fish, beans, vegetables, occasional pork beans.   GERD Patient states is well controlled on nexium.   BPH Patient states gets up once at night usually, takes flomax  Atrial fib Takes eliquis and aspirin. Denies easy bruising or blood in urine, stools. Denies palpitations, shortness of breath   Diabetes Patient not on antidiabetic medications, watches diet. On statin and ARB/ denies polydipsia, polyuria, polyphaiga.  Lab Results  Component Value Date   HGBA1C 6.9 (H) 10/27/2017    PHQ2/9: Depression screen [facility] 2/9 05/07/2018 10/27/2017 05/28/2017 04/27/2017 12/10/2016  Decreased Interest 0 0 0 0 0  Down, Depressed, Hopeless 0 0 0 0 0  PHQ - 2 Score 0 0 0 0 0  Altered sleeping 0 0 0 - -  Tired, decreased energy 0 0 0 - -  Change in appetite 0 0 0 - -  Feeling bad or failure about yourself  0 0 0 - -  Trouble concentrating 0 0 0 - -  Moving slowly or fidgety/restless 0 0 0 - -  Suicidal thoughts 0 0 0 - -  PHQ-9 Score 0 0 0 - -  Difficult doing  work/chores Not difficult at all Not difficult at all Not difficult at all - -    PHQ reviewed. Negative   Patient Active Problem List   Diagnosis Date Noted  . Type 2 diabetes mellitus ([place]) 04/27/2017  . Elevated prostate specific antigen (PSA) 09/12/2016  . Hypokalemia due to loss of potassium 09/12/2016  . Pain and swelling of toe of right foot 05/01/2016  . GERD (gastroesophageal reflux disease) 09/13/2015  . Hyperlipidemia 09/13/2015  . Fall at home 04/23/2015  . Atrial fibrillation ([place]) 10/17/2014  . Abnormal ECG 06/14/2014  . A-fib ([place]) 06/14/2014  . Bradycardia 06/14/2014  . BP (high blood pressure) 06/14/2014  . Sick sinus syndrome ([place]) 06/14/2014  . Breath shortness 06/14/2014  . Annual physical exam 06/14/2014  . Prostate cancer screening 06/14/2014  . Colon cancer screening 06/14/2014    Past Medical History:  Diagnosis Date  . Diabetes ([place])   . GERD (gastroesophageal reflux disease)   . Glaucoma   . Hyperlipidemia   . Hypertension   . Paroxysmal atrial fibrillation ([place])    Followed by Cardiology.    No past surgical history on file.  Social History   Tobacco Use  . Smoking status: Former Smoker    Types: Cigarettes  . Smokeless tobacco: Never Used  . Tobacco comment: smoking cessation materials not required; poor historian, unable to recall smoking history  Substance Use Topics  . Alcohol use: No    Alcohol/week: 0.0 standard drinks  Current Outpatient Medications:  .  apixaban (ELIQUIS) 2.5 MG TABS tablet, Take 1 tablet (2.5 mg total) by mouth 2 (two) times daily., Disp: 180 tablet, Rfl: 1 .  aspirin 81 MG tablet, Take 81 mg by mouth daily., Disp: , Rfl:  .  esomeprazole (NEXIUM) 40 MG capsule, Take 1 capsule (40 mg total) by mouth daily at 12 noon., Disp: 90 capsule, Rfl: 1 .  rosuvastatin (CRESTOR) 5 MG tablet, TAKE 1 TABLET BY MOUTH DAILY, Disp: 90 tablet, Rfl: 1 .  tamsulosin (FLOMAX) 0.4 MG CAPS capsule, Take 1 capsule (0.4 mg  total) by mouth daily., Disp: 30 capsule, Rfl: 11 .  traMADol (ULTRAM) 50 MG tablet, Take by mouth every 12 (twelve) hours as needed., Disp: , Rfl:  .  valsartan-hydrochlorothiazide (DIOVAN-HCT) 160-12.5 MG tablet, TAKE 1 TABLET BY MOUTH DAILY, Disp: 90 tablet, Rfl: 1  No Known Allergies  ROS  No other specific complaints in a complete review of systems (except as listed in HPI above).  Objective  There were no vitals filed for this visit.   There is no height or weight on file to calculate BMI.  Patient is alert, able to speak in full sentences without difficulty.   Assessment & Plan  1. Gastroesophageal reflux disease, esophagitis presence not specified stable - esomeprazole (NEXIUM) 40 MG capsule; Take 1 capsule (40 mg total) by mouth daily at 12 noon.  Dispense: 90 capsule; Refill: 1  2. Paroxysmal atrial fibrillation (HCC) stable - apixaban (ELIQUIS) 2.5 MG TABS tablet; Take 1 tablet (2.5 mg total) by mouth 2 (two) times daily.  Dispense: 180 tablet; Refill: 1  3. Mixed hyperlipidemia Continue statin   4. Essential hypertension Stable continue meds, unable to check BP   5. Type 2 diabetes mellitus without complication, without long-term current use of insulin (HCC) Under 7 without medication, monitor routinely at next visit due pandemic. Discussed diet.   6. Benign prostatic hyperplasia with nocturia Well controlled continue urology follow up     I discussed the assessment and treatment plan with the patient. The patient was provided an opportunity to ask questions and all were answered. The patient agreed with the plan and demonstrated an understanding of the instructions.   The patient was advised to call back or seek an in-person evaluation if the symptoms worsen or if the condition fails to improve as anticipated.  I provided 14 minutes of non-face-to-face time during this encounter.   Fredderick Severance, NP

## 2018-05-13 DIAGNOSIS — R001 Bradycardia, unspecified: Secondary | ICD-10-CM | POA: Diagnosis not present

## 2018-05-13 DIAGNOSIS — I480 Paroxysmal atrial fibrillation: Secondary | ICD-10-CM | POA: Diagnosis not present

## 2018-05-13 DIAGNOSIS — I10 Essential (primary) hypertension: Secondary | ICD-10-CM | POA: Diagnosis not present

## 2018-05-13 DIAGNOSIS — R0602 Shortness of breath: Secondary | ICD-10-CM | POA: Diagnosis not present

## 2018-05-13 DIAGNOSIS — I495 Sick sinus syndrome: Secondary | ICD-10-CM | POA: Diagnosis not present

## 2018-06-01 ENCOUNTER — Ambulatory Visit (INDEPENDENT_AMBULATORY_CARE_PROVIDER_SITE_OTHER): Payer: Medicare Other

## 2018-06-01 ENCOUNTER — Other Ambulatory Visit: Payer: Self-pay

## 2018-06-01 VITALS — BP 130/70 | HR 50 | Temp 97.4°F | Resp 16 | Ht 73.0 in | Wt 213.4 lb

## 2018-06-01 DIAGNOSIS — Z0000 Encounter for general adult medical examination without abnormal findings: Secondary | ICD-10-CM

## 2018-06-01 NOTE — Progress Notes (Addendum)
Subjective:   Charles Valencia is a 85 y.o. male who presents for Medicare Annual/Subsequent preventive examination.  Review of Systems:   Cardiac Risk Factors include: advanced age (>90men, >62 women);hypertension;male gender;dyslipidemia;diabetes mellitus     Objective:    Vitals: BP 130/70 (BP Location: Left Arm, Patient Position: Sitting, Cuff Size: Normal)   Pulse (!) 50   Temp (!) 97.4 F (36.3 C) (Oral)   Resp 16   Ht 6\' 1"  (1.854 m)   Wt 213 lb 6.4 oz (96.8 kg)   SpO2 98%   BMI 28.15 kg/m   Body mass index is 28.15 kg/m.  Advanced Directives 06/01/2018 05/28/2017 09/29/2016 09/16/2016 09/10/2016 07/02/2016 05/01/2016  Does Patient Have a Medical Advance Directive? No No No No No No No  Would patient like information on creating a medical advance directive? No - Patient declined Yes (MAU/Ambulatory/Procedural Areas - Information given) - - - - -    Tobacco Social History   Tobacco Use  Smoking Status Former Smoker  . Types: Cigarettes  Smokeless Tobacco Never Used  Tobacco Comment   smoking cessation materials not required; poor historian, unable to recall smoking history     Counseling given: Not Answered Comment: smoking cessation materials not required; poor historian, unable to recall smoking history   Clinical Intake:  Pre-visit preparation completed: Yes  Pain : No/denies pain     BMI - recorded: 28.15 Nutritional Status: BMI 25 -29 Overweight Nutritional Risks: None Diabetes: Yes CBG done?: No Did pt. bring in CBG monitor from home?: No   Nutrition Risk Assessment:  Has the patient had any N/V/D within the last 2 months?  No  Does the patient have any non-healing wounds?  No  Has the patient had any unintentional weight loss or weight gain?  No   Diabetes:  Is the patient diabetic?  Yes  If diabetic, was a CBG obtained today?  No  Did the patient bring in their glucometer from home?  No  How often do you monitor your CBG's? Pt does not  actively check his blood sugar.   Financial Strains and Diabetes Management:  Are you having any financial strains with the device, your supplies or your medication? No .  Does the patient want to be seen by Chronic Care Management for management of their diabetes?  No  Would the patient like to be referred to a Nutritionist or for Diabetic Management?  No   Diabetic Exams:  Diabetic Eye Exam: Completed 06/18/17 negative retinopathy.   Diabetic Foot Exam: Completed 10/27/17.   How often do you need to have someone help you when you read instructions, pamphlets, or other written materials from your doctor or pharmacy?: 1 - Never  Interpreter Needed?: No  Information entered by :: Clemetine Marker LPN  Past Medical History:  Diagnosis Date  . Diabetes ([place])   . GERD (gastroesophageal reflux disease)   . Glaucoma   . Hyperlipidemia   . Hypertension   . Paroxysmal atrial fibrillation ([place])    Followed by Cardiology.   History reviewed. No pertinent surgical history. Family History  Problem Relation Age of Onset  . Diabetes Sister   . Hyperlipidemia Sister   . Hypertension Sister   . Kidney disease Sister   . Alzheimer's disease Sister   . Bladder Cancer Neg Hx   . Kidney cancer Neg Hx   . AAA (abdominal aortic aneurysm) Neg Hx    Social History   Socioeconomic History  . Marital status: Divorced  Spouse name: Not on file  . Number of children: 0  . Years of education: Not on file  . Highest education level: 4th grade  Occupational History  . Occupation: Retired  Scientific laboratory technician  . Financial resource strain: Not hard at all  . Food insecurity:    Worry: Never true    Inability: Never true  . Transportation needs:    Medical: No    Non-medical: No  Tobacco Use  . Smoking status: Former Smoker    Types: Cigarettes  . Smokeless tobacco: Never Used  . Tobacco comment: smoking cessation materials not required; poor historian, unable to recall smoking history  Substance  and Sexual Activity  . Alcohol use: No    Alcohol/week: 0.0 standard drinks  . Drug use: No  . Sexual activity: Not Currently  Lifestyle  . Physical activity:    Days per week: 7 days    Minutes per session: 30 min  . Stress: Not at all  Relationships  . Social connections:    Talks on phone: More than three times a week    Gets together: Twice a week    Attends religious service: More than 4 times per year    Active member of club or organization: No    Attends meetings of clubs or organizations: Never    Relationship status: Divorced  Other Topics Concern  . Not on file  Social History Narrative  . Not on file    Outpatient Encounter Medications as of 06/01/2018  Medication Sig  . apixaban (ELIQUIS) 2.5 MG TABS tablet Take 1 tablet (2.5 mg total) by mouth 2 (two) times daily.  Marland Kitchen aspirin 81 MG tablet Take 81 mg by mouth daily.  Marland Kitchen esomeprazole (NEXIUM) 40 MG capsule Take 1 capsule (40 mg total) by mouth daily at 12 noon.  . rosuvastatin (CRESTOR) 5 MG tablet TAKE 1 TABLET BY MOUTH DAILY  . tamsulosin (FLOMAX) 0.4 MG CAPS capsule Take 1 capsule (0.4 mg total) by mouth daily.  . valsartan-hydrochlorothiazide (DIOVAN-HCT) 160-12.5 MG tablet TAKE 1 TABLET BY MOUTH DAILY  . traMADol (ULTRAM) 50 MG tablet Take by mouth every 12 (twelve) hours as needed.   No facility-administered encounter medications on file as of 06/01/2018.     Activities of Daily Living In your present state of health, do you have any difficulty performing the following activities: 06/01/2018 04/28/2018  Hearing? N Y  Comment declines hearing aids -  Vision? N N  Comment wears glasses -  Difficulty concentrating or making decisions? N N  Walking or climbing stairs? N N  Dressing or bathing? N N  Doing errands, shopping? N N  Preparing Food and eating ? N -  Using the Toilet? N -  In the past six months, have you accidently leaked urine? N -  Do you have problems with loss of bowel control? N -  Managing  your Medications? N -  Managing your Finances? N -  Housekeeping or managing your Housekeeping? N -  Some recent data might be hidden    Patient Care Team: Lada, Satira Anis, MD as PCP - General (Family Medicine) Yolonda Kida, MD as Consulting Physician (Cardiology) [name], MD as Consulting Physician (Urology)   Assessment:   This is a routine wellness examination for [facility].  Exercise Activities and Dietary recommendations Current Exercise Habits: Home exercise routine, Type of exercise: walking, Time (Minutes): 30, Frequency (Times/Week): 7, Weekly Exercise (Minutes/Week): 210, Intensity: Mild, Exercise limited by: None identified  Goals    .  DIET - INCREASE WATER INTAKE     Recommend to drink at least 6-8 8oz glasses of water per day.       Fall Risk Fall Risk  06/01/2018 05/07/2018 04/28/2018 10/27/2017 05/28/2017  Falls in the past year? 0 0 0 No No  Number falls in past yr: 0 0 0 - -  Injury with Fall? 0 0 0 - -  Comment - - - - -  Risk for fall due to : - - - - Impaired vision;History of fall(s)  Risk for fall due to: Comment - - - - wears eyeglasses  Follow up Falls prevention discussed Falls evaluation completed - - -   FALL RISK PREVENTION PERTAINING TO THE HOME:  Any stairs in or around the home? No  If so, do they handrails? No   Home free of loose throw rugs in walkways, pet beds, electrical cords, etc? Yes  Adequate lighting in your home to reduce risk of falls? Yes   ASSISTIVE DEVICES UTILIZED TO PREVENT FALLS:  Life alert? No  Use of a cane, walker or w/c? No  Grab bars in the bathroom? Yes  Shower chair or bench in shower? No  Elevated toilet seat or a handicapped toilet? No   DME ORDERS:  DME order needed?  No   TIMED UP AND GO:  Was the test performed? Yes .  Length of time to ambulate 10 feet: 6 sec.   GAIT:  Appearance of gait: Gait stead-fast and without the use of an assistive device.   Education: Fall risk prevention has  been discussed.  Intervention(s) required? No   Depression Screen PHQ 2/9 Scores 06/01/2018 05/07/2018 10/27/2017 05/28/2017  PHQ - 2 Score 0 0 0 0  PHQ- 9 Score 1 0 0 0    Cognitive Function     6CIT Screen 06/01/2018 05/28/2017  What Year? 0 points 0 points  What month? 0 points 3 points  What time? 0 points 3 points  Count back from 20 2 points 4 points  Months in reverse 4 points 4 points  Repeat phrase 10 points 10 points  Total Score 16 24    Immunization History  Administered Date(s) Administered  . Influenza, High Dose Seasonal PF 10/20/2014, 09/13/2015, 09/10/2016, 10/27/2017  . Pneumococcal Polysaccharide-23 09/10/2016    Qualifies for Shingles Vaccine? Yes . Due for Shingrix. Education has been provided regarding the importance of this vaccine. Pt has been advised to call insurance company to determine out of pocket expense. Advised may also receive vaccine at local pharmacy or Health Dept. Verbalized acceptance and understanding.  Tdap: Up to date  Flu Vaccine: Up to date  Pneumococcal Vaccine: Up to date   Screening Tests Health Maintenance  Topic Date Due  . HEMOGLOBIN A1C  04/28/2018  . OPHTHALMOLOGY EXAM  06/19/2018  . INFLUENZA VACCINE  08/07/2018  . FOOT EXAM  10/28/2018  . TETANUS/TDAP  12/15/2021  . PNA vac Low Risk Adult  Completed   Cancer Screenings:  Colorectal Screening:  No longer required.   Lung Cancer Screening: (Low Dose CT Chest recommended if Age 29-80 years, 30 pack-year currently smoking OR have quit w/in 15years.) does not qualify.   Additional Screening:  Hepatitis C Screening: no longer required  Vision Screening: Recommended annual ophthalmology exams for early detection of glaucoma and other disorders of the eye. Is the patient up to date with their annual eye exam?  Yes  Who is the provider or what is the name of the  office in which the pt attends annual eye exams? [facility] Screening: Recommended annual  dental exams for proper oral hygiene  Community Resource Referral:  CRR required this visit?  No      Plan:     I have personally reviewed and addressed the Medicare Annual Wellness questionnaire and have noted the following in the patient's chart:  A. Medical and social history B. Use of alcohol, tobacco or illicit drugs  C. Current medications and supplements D. Functional ability and status E.  Nutritional status F.  Physical activity G. Advance directives H. List of other physicians I.  Hospitalizations, surgeries, and ER visits in previous 12 months J.  [place] such as hearing and vision if needed, cognitive and depression L. Referrals and appointments   In addition, I have reviewed and discussed with patient certain preventive protocols, quality metrics, and best practice recommendations. A written personalized care plan for preventive services as well as general preventive health recommendations were provided to patient.   Signed,  Clemetine Marker, LPN Nurse Health Advisor   Nurse Notes: Pt accompanied to visit today by his sister and great niece. He is due for 6 month labs. Advised to schedule follow up in office appt when available.

## 2018-06-01 NOTE — Patient Instructions (Signed)
[name] , Thank you for taking time to come for your Medicare Wellness Visit. I appreciate your ongoing commitment to your health goals. Please review the following plan we discussed and let me know if I can assist you in the future.   Screening recommendations/referrals: Colonoscopy: no longer required Recommended yearly ophthalmology/optometry visit for glaucoma screening and checkup Recommended yearly dental visit for hygiene and checkup  Vaccinations: Influenza vaccine: done 10/27/17 Pneumococcal vaccine: done 09/10/16 Tdap vaccine: done 12/16/11 Shingles vaccine: Shingrix discussed. Please contact your pharmacy for coverage information.      Advanced directives: Advance directive discussed with you today. Even though you declined this today please call our office should you change your mind and we can give you the proper paperwork for you to fill out.  Conditions/risks identified: Keep up the great work!\  Next appointment: Please follow up in one year for your Medicare Annual Wellness visit.    Preventive Care 38 Years and Older, Male Preventive care refers to lifestyle choices and visits with your health care provider that can promote health and wellness. What does preventive care include?  A yearly physical exam. This is also called an annual well check.  Dental exams once or twice a year.  Routine eye exams. Ask your health care provider how often you should have your eyes checked.  Personal lifestyle choices, including:  Daily care of your teeth and gums.  Regular physical activity.  Eating a healthy diet.  Avoiding tobacco and drug use.  Limiting alcohol use.  Practicing safe sex.  Taking low doses of aspirin every day.  Taking vitamin and mineral supplements as recommended by your health care provider. What happens during an annual well check? The services and screenings done by your health care provider during your annual well check will depend on  your age, overall health, lifestyle risk factors, and family history of disease. Counseling  Your health care provider may ask you questions about your:  Alcohol use.  Tobacco use.  Drug use.  Emotional well-being.  Home and relationship well-being.  Sexual activity.  Eating habits.  History of falls.  Memory and ability to understand (cognition).  Work and work Statistician. Screening  You may have the following tests or measurements:  Height, weight, and BMI.  Blood pressure.  Lipid and cholesterol levels. These may be checked every 5 years, or more frequently if you are over 71 years old.  Skin check.  Lung cancer screening. You may have this screening every year starting at age 51 if you have a 30-pack-year history of smoking and currently smoke or have quit within the past 15 years.  Fecal occult blood test (FOBT) of the stool. You may have this test every year starting at age 74.  Flexible sigmoidoscopy or colonoscopy. You may have a sigmoidoscopy every 5 years or a colonoscopy every 10 years starting at age 18.  Prostate cancer screening. Recommendations will vary depending on your family history and other risks.  Hepatitis C blood test.  Hepatitis B blood test.  Sexually transmitted disease (STD) testing.  Diabetes screening. This is done by checking your blood sugar (glucose) after you have not eaten for a while (fasting). You may have this done every 1-3 years.  Abdominal aortic aneurysm (AAA) screening. You may need this if you are a current or former smoker.  Osteoporosis. You may be screened starting at age 45 if you are at high risk. Talk with your health care provider about your test results, treatment  options, and if necessary, the need for more tests. Vaccines  Your health care provider may recommend certain vaccines, such as:  Influenza vaccine. This is recommended every year.  Tetanus, diphtheria, and acellular pertussis (Tdap, Td) vaccine.  You may need a Td booster every 10 years.  Zoster vaccine. You may need this after age 57.  Pneumococcal 13-valent conjugate (PCV13) vaccine. One dose is recommended after age 17.  Pneumococcal polysaccharide (PPSV23) vaccine. One dose is recommended after age 47. Talk to your health care provider about which screenings and vaccines you need and how often you need them. This information is not intended to replace advice given to you by your health care provider. Make sure you discuss any questions you have with your health care provider. Document Released: 01/19/2015 Document Revised: 09/12/2015 Document Reviewed: 10/24/2014 Elsevier Interactive Patient Education  2017 [place] Prevention in the Home Falls can cause injuries. They can happen to people of all ages. There are many things you can do to make your home safe and to help prevent falls. What can I do on the outside of my home?  Regularly fix the edges of walkways and driveways and fix any cracks.  Remove anything that might make you trip as you walk through a door, such as a raised step or threshold.  Trim any bushes or trees on the path to your home.  Use bright outdoor lighting.  Clear any walking paths of anything that might make someone trip, such as rocks or tools.  Regularly check to see if handrails are loose or broken. Make sure that both sides of any steps have handrails.  Any raised decks and porches should have guardrails on the edges.  Have any leaves, snow, or ice cleared regularly.  Use sand or salt on walking paths during winter.  Clean up any spills in your garage right away. This includes oil or grease spills. What can I do in the bathroom?  Use night lights.  Install grab bars by the toilet and in the tub and shower. Do not use towel bars as grab bars.  Use non-skid mats or decals in the tub or shower.  If you need to sit down in the shower, use a plastic, non-slip stool.  Keep the  floor dry. Clean up any water that spills on the floor as soon as it happens.  Remove soap buildup in the tub or shower regularly.  Attach bath mats securely with double-sided non-slip rug tape.  Do not have throw rugs and other things on the floor that can make you trip. What can I do in the bedroom?  Use night lights.  Make sure that you have a light by your bed that is easy to reach.  Do not use any sheets or blankets that are too big for your bed. They should not hang down onto the floor.  Have a firm chair that has side arms. You can use this for support while you get dressed.  Do not have throw rugs and other things on the floor that can make you trip. What can I do in the kitchen?  Clean up any spills right away.  Avoid walking on wet floors.  Keep items that you use a lot in easy-to-reach places.  If you need to reach something above you, use a strong step stool that has a grab bar.  Keep electrical cords out of the way.  Do not use floor polish or wax that makes floors slippery.  If you must use wax, use non-skid floor wax.  Do not have throw rugs and other things on the floor that can make you trip. What can I do with my stairs?  Do not leave any items on the stairs.  Make sure that there are handrails on both sides of the stairs and use them. Fix handrails that are broken or loose. Make sure that handrails are as long as the stairways.  Check any carpeting to make sure that it is firmly attached to the stairs. Fix any carpet that is loose or worn.  Avoid having throw rugs at the top or bottom of the stairs. If you do have throw rugs, attach them to the floor with carpet tape.  Make sure that you have a light switch at the top of the stairs and the bottom of the stairs. If you do not have them, ask someone to add them for you. What else can I do to help prevent falls?  Wear shoes that:  Do not have high heels.  Have rubber bottoms.  Are comfortable and fit  you well.  Are closed at the toe. Do not wear sandals.  If you use a stepladder:  Make sure that it is fully opened. Do not climb a closed stepladder.  Make sure that both sides of the stepladder are locked into place.  Ask someone to hold it for you, if possible.  Clearly mark and make sure that you can see:  Any grab bars or handrails.  First and last steps.  Where the edge of each step is.  Use tools that help you move around (mobility aids) if they are needed. These include:  Canes.  Walkers.  Scooters.  Crutches.  Turn on the lights when you go into a dark area. Replace any light bulbs as soon as they burn out.  Set up your furniture so you have a clear path. Avoid moving your furniture around.  If any of your floors are uneven, fix them.  If there are any pets around you, be aware of where they are.  Review your medicines with your doctor. Some medicines can make you feel dizzy. This can increase your chance of falling. Ask your doctor what other things that you can do to help prevent falls. This information is not intended to replace advice given to you by your health care provider. Make sure you discuss any questions you have with your health care provider. Document Released: 10/19/2008 Document Revised: 05/31/2015 Document Reviewed: 01/27/2014 Elsevier Interactive Patient Education  2017 [employer].

## 2018-06-07 ENCOUNTER — Ambulatory Visit: Payer: Medicare Other | Admitting: Podiatry

## 2018-06-11 ENCOUNTER — Other Ambulatory Visit: Payer: Self-pay | Admitting: *Deleted

## 2018-06-11 DIAGNOSIS — C61 Malignant neoplasm of prostate: Secondary | ICD-10-CM

## 2018-06-14 ENCOUNTER — Encounter: Payer: Self-pay | Admitting: Urology

## 2018-06-14 ENCOUNTER — Other Ambulatory Visit: Payer: Self-pay

## 2018-06-15 ENCOUNTER — Ambulatory Visit: Payer: Medicare Other | Admitting: Urology

## 2018-06-16 ENCOUNTER — Other Ambulatory Visit: Payer: Self-pay

## 2018-06-17 ENCOUNTER — Encounter: Payer: Self-pay | Admitting: Podiatry

## 2018-06-17 ENCOUNTER — Ambulatory Visit (INDEPENDENT_AMBULATORY_CARE_PROVIDER_SITE_OTHER): Payer: Medicare Other | Admitting: Podiatry

## 2018-06-17 ENCOUNTER — Other Ambulatory Visit: Payer: Self-pay

## 2018-06-17 DIAGNOSIS — M79675 Pain in left toe(s): Secondary | ICD-10-CM | POA: Diagnosis not present

## 2018-06-17 DIAGNOSIS — B351 Tinea unguium: Secondary | ICD-10-CM | POA: Diagnosis not present

## 2018-06-17 DIAGNOSIS — M79674 Pain in right toe(s): Secondary | ICD-10-CM | POA: Diagnosis not present

## 2018-06-17 DIAGNOSIS — E119 Type 2 diabetes mellitus without complications: Secondary | ICD-10-CM | POA: Diagnosis not present

## 2018-06-17 DIAGNOSIS — D689 Coagulation defect, unspecified: Secondary | ICD-10-CM | POA: Diagnosis not present

## 2018-06-17 NOTE — Progress Notes (Signed)
Complaint:  Visit Type: Patient returns to my office for continued preventative foot care services. Complaint: Patient states" my nails have grown long and thick and become painful to walk and wear shoes" Patient has been diagnosed with DM with no foot complications. The patient presents for preventative foot care services. No changes to ROS.  Patient is taking eliquiss.  Podiatric Exam: Vascular: dorsalis pedis and posterior tibial pulses are palpable bilateral. Capillary return is immediate. Temperature gradient is WNL. Skin turgor WNL  Sensorium: Normal Semmes Weinstein monofilament test. Normal tactile sensation bilaterally. Nail Exam: Pt has thick disfigured discolored nails with subungual debris noted bilateral entire nail hallux through fifth toenails Ulcer Exam: There is no evidence of ulcer or pre-ulcerative changes or infection. Orthopedic Exam: Muscle tone and strength are WNL. No limitations in general ROM. No crepitus or effusions noted. Foot type and digits show no abnormalities. Bony prominences are unremarkable. Skin: No Porokeratosis. No infection or ulcers  Diagnosis:  Onychomycosis, , Pain in right toe, pain in left toes  Treatment & Plan Procedures and Treatment: Consent by patient was obtained for treatment procedures.   Debridement of mycotic and hypertrophic toenails, 1 through 5 bilateral and clearing of subungual debris. No ulceration, no infection noted.  Return Visit-Office Procedure: Patient instructed to return to the office for a follow up visit 4 months for continued evaluation and treatment.    Gardiner Barefoot DPM

## 2018-06-18 ENCOUNTER — Other Ambulatory Visit: Payer: Medicare Other

## 2018-06-18 ENCOUNTER — Other Ambulatory Visit: Payer: Self-pay | Admitting: Family Medicine

## 2018-06-18 DIAGNOSIS — C61 Malignant neoplasm of prostate: Secondary | ICD-10-CM

## 2018-06-19 LAB — PSA: Prostate Specific Ag, Serum: <0.1 ng/mL (ref 0.0–4.0)

## 2018-06-21 ENCOUNTER — Telehealth: Payer: Self-pay | Admitting: Urology

## 2018-06-21 NOTE — Telephone Encounter (Signed)
[place] [id number] 06-21-2018 06-21-2019 NO PA REQUIRED FOR MCD

## 2018-06-22 ENCOUNTER — Ambulatory Visit (INDEPENDENT_AMBULATORY_CARE_PROVIDER_SITE_OTHER): Payer: Medicare Other | Admitting: Urology

## 2018-06-22 ENCOUNTER — Encounter: Payer: Self-pay | Admitting: Urology

## 2018-06-22 ENCOUNTER — Other Ambulatory Visit: Payer: Self-pay

## 2018-06-22 VITALS — BP 165/83 | HR 66 | Ht 73.0 in | Wt 215.0 lb

## 2018-06-22 DIAGNOSIS — N400 Enlarged prostate without lower urinary tract symptoms: Secondary | ICD-10-CM

## 2018-06-22 DIAGNOSIS — C61 Malignant neoplasm of prostate: Secondary | ICD-10-CM

## 2018-06-22 MED ORDER — LEUPROLIDE ACETATE (6 MONTH) 45 MG IM KIT
45.0000 mg | PACK | Freq: Once | INTRAMUSCULAR | Status: AC
  Administered 2018-06-22: 45 mg via INTRAMUSCULAR

## 2018-06-22 NOTE — Progress Notes (Signed)
06/22/2018 9:39 AM   [name] 1931/05/13 [id number]  Referring provider: Arnetha Courser, MD [address] [place],  [place] [address]  Chief Complaint  Patient presents with  . Prostate Cancer    HPI: 85 year old male with high risk prostate cancer who returns today for routine follow-up.  He was last seen 6 months ago.   Prostate cancer history: He initially presented in 10/2016 with elevated PSA to 16.9.PSA was checked by his PCP on 09/10/2016 and markedly elevated to 16.9. Last known PSA in 2012 8.4.Rectal exam did show an enlarged gland which was massively enlarged with induration on the right lateral ridge without a discrete nodule although the exam was somewhat limited to habitus.PSA was repeated on 01/12/2017 aroseto 35.7.  Patient was extremely hesitant to undergo prostate biopsy due to concern for the enema thus this was delayed until 04/15/2017. Ultimately prostate biopsy on this day revealed high risk prostate cancer,Gleason 4+5 up to 100% of the tissue including allbiopsiesright side of the biopsy in 2 of the left.  TRUS vol 53.  He underwent staging with CT scan and bone scan4/2019both of which are negative for any evidence of metastatic disease. Notably, there is borderline prominence of the seminal vesicles but otherwise no lymphadenopathy.  He was started on diuretics followed by initiation of Lupron only given his age and comorbidities.  Recent PSA undetectable as of 06/18/2018.  He is due for Lupron today.  He did have baseline significant urinary symptoms on Flomax as well as nocturia x3-4 and borderline elevated PVR to the 200 range.  He reports that he has no further urinary symptoms.  He is able to empty his bladder with out urgency or frequency.  He does have some occasional nocturia but this is also improved.  He continues on Flomax.  No dysuria or gross hematuria.  He has not been taking calcium and vitamin D.  PMH: Past  Medical History:  Diagnosis Date  . Diabetes ([place])   . GERD (gastroesophageal reflux disease)   . Glaucoma   . Hyperlipidemia   . Hypertension   . Paroxysmal atrial fibrillation ([place])    Followed by Cardiology.    Surgical History: No past surgical history on file.  Home Medications:  Allergies as of 06/22/2018   No Known Allergies     Medication List       Accurate as of June 22, 2018  9:39 AM. If you have any questions, ask your nurse or doctor.        apixaban 2.5 MG Tabs tablet Commonly known as: Eliquis Take 1 tablet (2.5 mg total) by mouth 2 (two) times daily.   aspirin 81 MG tablet Take 81 mg by mouth daily.   esomeprazole 40 MG capsule Commonly known as: NEXIUM Take 1 capsule (40 mg total) by mouth daily at 12 noon.   rosuvastatin 5 MG tablet Commonly known as: CRESTOR TAKE 1 TABLET BY MOUTH DAILY   tamsulosin 0.4 MG Caps capsule Commonly known as: Flomax Take 1 capsule (0.4 mg total) by mouth daily.   traMADol 50 MG tablet Commonly known as: ULTRAM Take by mouth every 12 (twelve) hours as needed.   valsartan-hydrochlorothiazide 160-12.5 MG tablet Commonly known as: DIOVAN-HCT TAKE 1 TABLET BY MOUTH DAILY       Allergies: No Known Allergies  Family History: Family History  Problem Relation Age of Onset  . Diabetes Sister   . Hyperlipidemia Sister   . Hypertension Sister   . Kidney disease  Sister   . Alzheimer's disease Sister   . Bladder Cancer Neg Hx   . Kidney cancer Neg Hx   . AAA (abdominal aortic aneurysm) Neg Hx     Social History:  reports that he has quit smoking. His smoking use included cigarettes. He has never used smokeless tobacco. He reports that he does not drink alcohol or use drugs.  ROS: UROLOGY Frequent Urination?: No Hard to postpone urination?: No Burning/pain with urination?: No Get up at night to urinate?: No Leakage of urine?: No Urine stream starts and stops?: No Trouble starting stream?: No Do you have  to strain to urinate?: No Blood in urine?: No Urinary tract infection?: No Sexually transmitted disease?: No Injury to kidneys or bladder?: No Painful intercourse?: No Weak stream?: No Erection problems?: No Penile pain?: No  Gastrointestinal Nausea?: No Vomiting?: No Indigestion/heartburn?: No Diarrhea?: No Constipation?: No  Constitutional Fever: No Night sweats?: No Weight loss?: No Fatigue?: No  Skin Skin rash/lesions?: No Itching?: No  Eyes Blurred vision?: No Double vision?: No  Ears/Nose/Throat Sore throat?: No Sinus problems?: No  Hematologic/Lymphatic Swollen glands?: No Easy bruising?: No  Cardiovascular Leg swelling?: No Chest pain?: No  Respiratory Cough?: No Shortness of breath?: No  Endocrine Excessive thirst?: No  Musculoskeletal Back pain?: No Joint pain?: No  Neurological Headaches?: No Dizziness?: No  Psychologic Depression?: No Anxiety?: No  Physical Exam: BP (!) 165/83   Pulse 66   Ht 6\' 1"  (1.854 m)   Wt 215 lb (97.5 kg)   BMI 28.37 kg/m   Constitutional:  Alert and oriented, No acute distress.  Pleasant. HEENT: [place] AT, moist mucus membranes.  Trachea midline, no masses. Cardiovascular: No clubbing, cyanosis, or edema. Respiratory: Normal respiratory effort, no increased work of breathing. Skin: No rashes, bruises or suspicious lesions. Neurologic: Grossly intact, no focal deficits, moving all 4 extremities. Psychiatric: Normal mood and affect.  Laboratory Data: Component     Latest Ref Rng & Units 01/12/2017 06/10/2017 10/20/2017 06/18/2018  Prostate Specific Ag, Serum     0.0 - 4.0 ng/mL 35.7 (H) 0.8 <0.1 <0.1     Assessment & Plan:    1. Prostate cancer (HCC) PSA is undetectable Symptomatically improved on Lupron We will continue ADT for control of locally advanced nonmetastatic high risk prostate cancer given his age and comorbidities He is agreeable this plan Plan for return in 6 months with PSA prior for  ongoing Lupron injections Reviewed bone health recommendations  2. Benign prostatic hyperplasia without lower urinary tract symptoms Dramatic improvement in urinary symptoms with control of his prostate cancer I recommended that stopping Flomax to see if he notices any change in his urinary symptoms, resume if needed    Return in about 6 months (around 12/22/2018), or PSA prior for lupron, for PSA prior for lupron.  [name], MD  [facility] [address], [place], [place] [address] [phone]

## 2018-06-22 NOTE — Patient Instructions (Signed)
Discussed importance of bone health on ADT, recommend 1000-1200 mg daily calcium suppliment and 800-1000 IU vit D daily.  Also encouraged weight being exercises and cardiovascular health.  

## 2018-06-22 NOTE — Progress Notes (Signed)
Lupron IM Injection   Due to Prostate Cancer patient is present today for a Lupron Injection.  Medication: Lupron 6 month Dose: 45mg   Location: right upper outer buttocks Lot: [id number] Exp: 06/07/2020  Patient tolerated well, no complications were noted  Performed by: Verlene Mayer, CMA   Follow up: 6 months

## 2018-07-05 ENCOUNTER — Other Ambulatory Visit: Payer: Self-pay

## 2018-07-05 ENCOUNTER — Ambulatory Visit (INDEPENDENT_AMBULATORY_CARE_PROVIDER_SITE_OTHER): Payer: Medicare Other | Admitting: Nurse Practitioner

## 2018-07-05 ENCOUNTER — Telehealth: Payer: Self-pay | Admitting: Nurse Practitioner

## 2018-07-05 ENCOUNTER — Encounter: Payer: Self-pay | Admitting: Nurse Practitioner

## 2018-07-05 DIAGNOSIS — R6889 Other general symptoms and signs: Secondary | ICD-10-CM | POA: Diagnosis not present

## 2018-07-05 DIAGNOSIS — Z20822 Contact with and (suspected) exposure to covid-19: Secondary | ICD-10-CM

## 2018-07-05 DIAGNOSIS — R05 Cough: Secondary | ICD-10-CM | POA: Diagnosis not present

## 2018-07-05 DIAGNOSIS — R509 Fever, unspecified: Secondary | ICD-10-CM | POA: Diagnosis not present

## 2018-07-05 DIAGNOSIS — R059 Cough, unspecified: Secondary | ICD-10-CM

## 2018-07-05 MED ORDER — DM-GUAIFENESIN ER 30-600 MG PO TB12: 1.0000 | ORAL_TABLET | Freq: Two times a day (BID) | ORAL | 0 refills | Status: DC

## 2018-07-05 NOTE — Progress Notes (Signed)
Virtual Visit via Telephone Note  I connected with Charles Valencia on 07/05/18 at  2:40 PM EDT by telephone and verified that I am speaking with the correct person using two identifiers.   Staff discussed the limitations, risks, security and privacy concerns of performing an evaluation and management service by telephone and the availability of in person appointments. Staff also discussed with the patient that there may be a patient responsible charge related to this service. The patient expressed understanding and agreed to proceed.  Patients location: home My location: work office  Other people in meeting: none    HPI  Patient states he has had cough, fatigue, runny nose started Saturday.  States yesterday came out of the shower and felt hot but hasn't felt like that since. States yesterday cough was very bad and keeping him awake at night, but today is feeling improved. Denies shortness of breath, chest pain, chills, facial pain or fullness, sore throat, nausea, vomiting, diarrhea, loss of sense of smell/taste, myalgia.  States has been self-isolating.   PHQ2/9: Depression screen [facility] 2/9 07/05/2018 06/01/2018 05/07/2018 10/27/2017 05/28/2017  Decreased Interest 0 0 0 0 0  Down, Depressed, Hopeless 0 0 0 0 0  PHQ - 2 Score 0 0 0 0 0  Altered sleeping 0 1 0 0 0  Tired, decreased energy 0 0 0 0 0  Change in appetite 0 0 0 0 0  Feeling bad or failure about yourself  0 0 0 0 0  Trouble concentrating 0 0 0 0 0  Moving slowly or fidgety/restless 0 0 0 0 0  Suicidal thoughts 0 0 0 0 0  PHQ-9 Score 0 1 0 0 0  Difficult doing work/chores Not difficult at all Not difficult at all Not difficult at all Not difficult at all Not difficult at all     PHQ reviewed. Negative  Patient Active Problem List   Diagnosis Date Noted  . Coagulation disorder ([place]) 06/17/2018  . Type 2 diabetes mellitus ([place]) 04/27/2017  . Elevated prostate specific antigen (PSA) 09/12/2016  . Hypokalemia due to loss of  potassium 09/12/2016  . Pain due to onychomycosis of toenails of both feet 05/01/2016  . GERD (gastroesophageal reflux disease) 09/13/2015  . Hyperlipidemia 09/13/2015  . Fall at home 04/23/2015  . Atrial fibrillation ([place]) 10/17/2014  . Abnormal ECG 06/14/2014  . A-fib ([place]) 06/14/2014  . Bradycardia 06/14/2014  . BP (high blood pressure) 06/14/2014  . Sick sinus syndrome ([place]) 06/14/2014  . Breath shortness 06/14/2014  . Annual physical exam 06/14/2014  . Prostate cancer screening 06/14/2014  . Colon cancer screening 06/14/2014    Past Medical History:  Diagnosis Date  . Diabetes ([place])   . GERD (gastroesophageal reflux disease)   . Glaucoma   . Hyperlipidemia   . Hypertension   . Paroxysmal atrial fibrillation ([place])    Followed by Cardiology.    History reviewed. No pertinent surgical history.  Social History   Tobacco Use  . Smoking status: Former Smoker    Types: Cigarettes  . Smokeless tobacco: Never Used  . Tobacco comment: smoking cessation materials not required; poor historian, unable to recall smoking history  Substance Use Topics  . Alcohol use: No    Alcohol/week: 0.0 standard drinks     Current Outpatient Medications:  .  apixaban (ELIQUIS) 2.5 MG TABS tablet, Take 1 tablet (2.5 mg total) by mouth 2 (two) times daily., Disp: 180 tablet, Rfl: 1 .  aspirin 81 MG tablet, Take 81 mg  by mouth daily., Disp: , Rfl:  .  esomeprazole (NEXIUM) 40 MG capsule, Take 1 capsule (40 mg total) by mouth daily at 12 noon., Disp: 90 capsule, Rfl: 1 .  rosuvastatin (CRESTOR) 5 MG tablet, TAKE 1 TABLET BY MOUTH DAILY, Disp: 90 tablet, Rfl: 1 .  tamsulosin (FLOMAX) 0.4 MG CAPS capsule, Take 1 capsule (0.4 mg total) by mouth daily., Disp: 30 capsule, Rfl: 11 .  traMADol (ULTRAM) 50 MG tablet, Take by mouth every 12 (twelve) hours as needed., Disp: , Rfl:  .  valsartan-hydrochlorothiazide (DIOVAN-HCT) 160-12.5 MG tablet, TAKE 1 TABLET BY MOUTH DAILY, Disp: 90 tablet, Rfl:  1  No Known Allergies  ROS   No other specific complaints in a complete review of systems (except as listed in HPI above).  Objective  There were no vitals filed for this visit.   There is no height or weight on file to calculate BMI.  Patient is alert, able to speak in full sentences without difficulty.   Assessment & Plan 1. Cough - dextromethorphan-guaiFENesin (MUCINEX DM) 30-600 MG 12hr tablet; Take 1 tablet by mouth 2 (two) times daily.  Dispense: 30 tablet; Refill: 0  2. Feels feverish Tylenol PRN   3. Suspected Covid-19 Virus Infection Will order testing    disucssed OTC management and need for ROC vs ED  I discussed the assessment and treatment plan with the patient. The patient was provided an opportunity to ask questions and all were answered. The patient agreed with the plan and demonstrated an understanding of the instructions.   The patient was advised to call back or seek an in-person evaluation if the symptoms worsen or if the condition fails to improve as anticipated.  I provided 11 minutes of non-face-to-face time during this encounter.   Fredderick Severance, NP

## 2018-07-05 NOTE — Telephone Encounter (Signed)
Patient scheduled for COVID 19 test 07/06/2018 at 10 am at [facility] building.  Testing protocols reviewed with patient.

## 2018-07-05 NOTE — Telephone Encounter (Signed)
Needs [phone] testing Cough, subjective fevers Advanced age

## 2018-07-05 NOTE — Addendum Note (Signed)
Addended by: Denyce Robert on: 07/05/2018 03:39 PM   Modules accepted: Orders

## 2018-07-06 ENCOUNTER — Other Ambulatory Visit: Payer: Medicare Other

## 2018-07-06 DIAGNOSIS — Z20822 Contact with and (suspected) exposure to covid-19: Secondary | ICD-10-CM

## 2018-07-06 DIAGNOSIS — R6889 Other general symptoms and signs: Secondary | ICD-10-CM | POA: Diagnosis not present

## 2018-07-11 LAB — NOVEL CORONAVIRUS, NAA: SARS-CoV-2, NAA: NOT DETECTED

## 2018-07-14 ENCOUNTER — Ambulatory Visit: Payer: Medicare Other | Admitting: Family Medicine

## 2018-07-20 ENCOUNTER — Ambulatory Visit: Payer: Medicare Other | Admitting: Nurse Practitioner

## 2018-08-24 ENCOUNTER — Other Ambulatory Visit: Payer: Self-pay

## 2018-08-24 NOTE — Patient Outreach (Signed)
[facility]) Care Management  08/24/2018  Garland Hincapie 23-Dec-1931 [id number]   Medication Adherence call to Mr. Lucas Winograd [facility] Identifiers Verify spoke with patient he is past due on Rosuvastatin 5 mg he explain he takes his medication on a regular basis but his daughter takes care of his medication she was not home patient is past due under [place].   [facility] Management Direct Dial [phone]  Fax [phone] [email or web address]

## 2018-09-07 ENCOUNTER — Other Ambulatory Visit: Payer: Self-pay | Admitting: Family Medicine

## 2018-09-14 ENCOUNTER — Other Ambulatory Visit: Payer: Self-pay

## 2018-09-14 NOTE — Patient Outreach (Signed)
Charles Valencia [facility]) Care Management  09/14/2018  Charles Valencia April 09, 1931 [id number]   Medication Adherence call to [name] spoke with patient he ask to call [place] daughter she takes care of his medication,grand daughter did not answer telephone number belongs to a bank [phone] ) Charles Valencia is showing past due under [place].  [place] Management Direct Dial [phone]  Fax [phone] [email or web address]

## 2018-10-07 ENCOUNTER — Ambulatory Visit (INDEPENDENT_AMBULATORY_CARE_PROVIDER_SITE_OTHER): Payer: Medicare Other | Admitting: Family Medicine

## 2018-10-07 ENCOUNTER — Encounter: Payer: Self-pay | Admitting: Family Medicine

## 2018-10-07 ENCOUNTER — Other Ambulatory Visit: Payer: Self-pay

## 2018-10-07 DIAGNOSIS — I10 Essential (primary) hypertension: Secondary | ICD-10-CM | POA: Diagnosis not present

## 2018-10-07 DIAGNOSIS — I480 Paroxysmal atrial fibrillation: Secondary | ICD-10-CM

## 2018-10-07 DIAGNOSIS — K219 Gastro-esophageal reflux disease without esophagitis: Secondary | ICD-10-CM

## 2018-10-07 DIAGNOSIS — E782 Mixed hyperlipidemia: Secondary | ICD-10-CM

## 2018-10-07 DIAGNOSIS — C61 Malignant neoplasm of prostate: Secondary | ICD-10-CM | POA: Diagnosis not present

## 2018-10-07 DIAGNOSIS — D689 Coagulation defect, unspecified: Secondary | ICD-10-CM

## 2018-10-07 DIAGNOSIS — E119 Type 2 diabetes mellitus without complications: Secondary | ICD-10-CM

## 2018-10-07 NOTE — Progress Notes (Signed)
Name: Charles Valencia   MRN: [id number]    DOB: 11/16/1931   Date:10/07/2018       Progress Note  Subjective  Chief Complaint  Chief Complaint  Patient presents with  . Follow-up    3 Month Follow Up    I connected with  Charles Valencia on 10/07/18 at 10:20 AM EDT by telephone and verified that I am speaking with the correct person using two identifiers.  I discussed the limitations, risks, security and privacy concerns of performing an evaluation and management service by telephone and the availability of in person appointments. Staff also discussed with the patient that there may be a patient responsible charge related to this service. Patient Location: Home Provider Location: Office Additional Individuals present: None  HPI  Hypertension: Patient takes valsartan-HCTZ 160-12.5mg  daily. Denies chest pain, dizziness, lightheadedness or headaches; no BLE edema.   Hyperlipidemia: Rosuvastatin 5mg  nightly; No myalgias; wants to remain on medication for now.  States eats a lot of fish, beans, vegetables, occasional pork beans.   GERD: Patient states is well controlled on nexium. No abdominal pain.  Prostate Cancer: Patient states gets up once at night usually, takes flomax and is doing well on this. Seeing Dr. Erlene Quan with Urology for management, last visit was in June 2020.  Atrial fib: Takes eliquis and aspirin. Denies easy bruising or blood in urine, stools. Denies palpitations, shortness of breath   Diabetes: Patient not on antidiabetic medications, watches diet. On statin and ARB/ denies polydipsia or polyphaiga. Has some polyuria due to enlarged prostate.  Patient Active Problem List   Diagnosis Date Noted  . Coagulation disorder ([place]) 06/17/2018  . Type 2 diabetes mellitus ([place]) 04/27/2017  . Elevated prostate specific antigen (PSA) 09/12/2016  . GERD (gastroesophageal reflux disease) 09/13/2015  . Hyperlipidemia 09/13/2015  . Atrial fibrillation ([place]) 10/17/2014  .  Bradycardia 06/14/2014  . BP (high blood pressure) 06/14/2014  . Sick sinus syndrome ([place]) 06/14/2014    No past surgical history on file.  Family History  Problem Relation Age of Onset  . Diabetes Sister   . Hyperlipidemia Sister   . Hypertension Sister   . Kidney disease Sister   . Alzheimer's disease Sister   . Bladder Cancer Neg Hx   . Kidney cancer Neg Hx   . AAA (abdominal aortic aneurysm) Neg Hx     Social History   Socioeconomic History  . Marital status: Divorced    Spouse name: Not on file  . Number of children: 0  . Years of education: Not on file  . Highest education level: 4th grade  Occupational History  . Occupation: Retired  Scientific laboratory technician  . Financial resource strain: Not hard at all  . Food insecurity    Worry: Never true    Inability: Never true  . Transportation needs    Medical: No    Non-medical: No  Tobacco Use  . Smoking status: Former Smoker    Types: Cigarettes  . Smokeless tobacco: Never Used  . Tobacco comment: smoking cessation materials not required; poor historian, unable to recall smoking history  Substance and Sexual Activity  . Alcohol use: No    Alcohol/week: 0.0 standard drinks  . Drug use: No  . Sexual activity: Not Currently  Lifestyle  . Physical activity    Days per week: 7 days    Minutes per session: 30 min  . Stress: Not at all  Relationships  . Social connections    Talks on phone: More than  three times a week    Gets together: Twice a week    Attends religious service: More than 4 times per year    Active member of club or organization: No    Attends meetings of clubs or organizations: Never    Relationship status: Divorced  . Intimate partner violence    Fear of current or ex partner: No    Emotionally abused: No    Physically abused: No    Forced sexual activity: No  Other Topics Concern  . Not on file  Social History Narrative  . Not on file     Current Outpatient Medications:  .  apixaban (ELIQUIS)  2.5 MG TABS tablet, Take 1 tablet (2.5 mg total) by mouth 2 (two) times daily., Disp: 180 tablet, Rfl: 1 .  aspirin 81 MG tablet, Take 81 mg by mouth daily., Disp: , Rfl:  .  dextromethorphan-guaiFENesin (MUCINEX DM) 30-600 MG 12hr tablet, Take 1 tablet by mouth 2 (two) times daily., Disp: 30 tablet, Rfl: 0 .  esomeprazole (NEXIUM) 40 MG capsule, Take 1 capsule (40 mg total) by mouth daily at 12 noon., Disp: 90 capsule, Rfl: 1 .  rosuvastatin (CRESTOR) 5 MG tablet, TAKE 1 TABLET BY MOUTH DAILY, Disp: 90 tablet, Rfl: 1 .  tamsulosin (FLOMAX) 0.4 MG CAPS capsule, Take 1 capsule (0.4 mg total) by mouth daily., Disp: 30 capsule, Rfl: 11 .  traMADol (ULTRAM) 50 MG tablet, Take by mouth every 12 (twelve) hours as needed., Disp: , Rfl:  .  valsartan-hydrochlorothiazide (DIOVAN-HCT) 160-12.5 MG tablet, TAKE 1 TABLET BY MOUTH DAILY, Disp: 90 tablet, Rfl: 1  No Known Allergies  I personally reviewed active problem list, medication list, allergies, notes from last encounter, lab results with the patient/caregiver today.   ROS  Constitutional: Negative for fever or weight change.  Respiratory: Negative for cough and shortness of breath.   Cardiovascular: Negative for chest pain or palpitations.  Gastrointestinal: Negative for abdominal pain, no bowel changes.  Musculoskeletal: Negative for gait problem or joint swelling.  Skin: Negative for rash.  Neurological: Negative for dizziness or headache.  No other specific complaints in a complete review of systems (except as listed in HPI above).  Objective  Virtual encounter, vitals not obtained.  There is no height or weight on file to calculate BMI.  Physical Exam  Pulmonary/Chest: Effort normal. No respiratory distress. Speaking in complete sentences Neurological: Pt is alert and oriented to person, place, and time. Speech is normal Psychiatric: Patient has a normal mood and affect. behavior is normal. Judgment and thought content normal.   No  results found for this or any previous visit (from the past 72 hour(s)).  PHQ2/9: Depression screen [facility] 2/9 10/07/2018 07/05/2018 06/01/2018 05/07/2018 10/27/2017  Decreased Interest 0 0 0 0 0  Down, Depressed, Hopeless 0 0 0 0 0  PHQ - 2 Score 0 0 0 0 0  Altered sleeping 0 0 1 0 0  Tired, decreased energy 0 0 0 0 0  Change in appetite 0 0 0 0 0  Feeling bad or failure about yourself  0 0 0 0 0  Trouble concentrating 0 0 0 0 0  Moving slowly or fidgety/restless 0 0 0 0 0  Suicidal thoughts 0 0 0 0 0  PHQ-9 Score 0 0 1 0 0  Difficult doing work/chores Not difficult at all Not difficult at all Not difficult at all Not difficult at all Not difficult at all  Some recent data might be hidden  PHQ-2/9 Result is negative.    Fall Risk: Fall Risk  10/07/2018 07/05/2018 06/01/2018 05/07/2018 04/28/2018  Falls in the past year? 0 0 0 0 0  Number falls in past yr: 0 0 0 0 0  Injury with Fall? 0 0 0 0 0  Comment - - - - -  Risk for fall due to : - - - - -  Risk for fall due to: Comment - - - - -  Follow up - - Falls prevention discussed Falls evaluation completed -    Assessment & Plan  1. Essential hypertension - doing well on current regimen  - COMPLETE METABOLIC PANEL WITH GFR  2. Mixed hyperlipidemia - Wants to continue statin therapy for now - Lipid panel  3. Gastroesophageal reflux disease, unspecified whether esophagitis present - Doing well on current regimen; avoiding triggers  4. Prostate cancer [facility]) - Seeing urology  5. Paroxysmal atrial fibrillation (H[facility]) - Tolerating eliquis without any signs or symptoms of bleeding  6. Coagulation disorder ([place]) - Tolerating eliquis without any signs or symptoms of bleeding  7. Type 2 diabetes mellitus without complication, without long-term current use of insulin (H[facility]) - COMPLETE METABOLIC PANEL WITH GFR - Hemoglobin A1c - Lipid panel  I discussed the assessment and treatment plan with the patient. The patient was provided an  opportunity to ask questions and all were answered. The patient agreed with the plan and demonstrated an understanding of the instructions.   The patient was advised to call back or seek an in-person evaluation if the symptoms worsen or if the condition fails to improve as anticipated.  I provided 18 minutes of non-face-to-face time during this encounter.  [name], FNP

## 2018-10-13 ENCOUNTER — Other Ambulatory Visit: Payer: Self-pay

## 2018-10-13 ENCOUNTER — Ambulatory Visit (INDEPENDENT_AMBULATORY_CARE_PROVIDER_SITE_OTHER): Payer: Medicare Other

## 2018-10-13 VITALS — BP 136/72 | HR 70

## 2018-10-13 DIAGNOSIS — Z23 Encounter for immunization: Secondary | ICD-10-CM | POA: Diagnosis not present

## 2018-10-19 DIAGNOSIS — R0602 Shortness of breath: Secondary | ICD-10-CM | POA: Diagnosis not present

## 2018-10-19 DIAGNOSIS — I480 Paroxysmal atrial fibrillation: Secondary | ICD-10-CM | POA: Diagnosis not present

## 2018-10-19 DIAGNOSIS — I10 Essential (primary) hypertension: Secondary | ICD-10-CM | POA: Diagnosis not present

## 2018-10-19 DIAGNOSIS — I495 Sick sinus syndrome: Secondary | ICD-10-CM | POA: Diagnosis not present

## 2018-10-19 DIAGNOSIS — R001 Bradycardia, unspecified: Secondary | ICD-10-CM | POA: Diagnosis not present

## 2018-10-21 ENCOUNTER — Encounter: Payer: Self-pay | Admitting: Podiatry

## 2018-10-21 ENCOUNTER — Other Ambulatory Visit: Payer: Self-pay

## 2018-10-21 ENCOUNTER — Ambulatory Visit (INDEPENDENT_AMBULATORY_CARE_PROVIDER_SITE_OTHER): Payer: Medicare Other | Admitting: Podiatry

## 2018-10-21 DIAGNOSIS — D689 Coagulation defect, unspecified: Secondary | ICD-10-CM | POA: Diagnosis not present

## 2018-10-21 DIAGNOSIS — M79674 Pain in right toe(s): Secondary | ICD-10-CM

## 2018-10-21 DIAGNOSIS — M79675 Pain in left toe(s): Secondary | ICD-10-CM | POA: Diagnosis not present

## 2018-10-21 DIAGNOSIS — E119 Type 2 diabetes mellitus without complications: Secondary | ICD-10-CM | POA: Diagnosis not present

## 2018-10-21 DIAGNOSIS — B351 Tinea unguium: Secondary | ICD-10-CM | POA: Diagnosis not present

## 2018-10-21 NOTE — Progress Notes (Signed)
Complaint:  Visit Type: Patient returns to my office for continued preventative foot care services. Complaint: Patient states" my nails have grown long and thick and become painful to walk and wear shoes" Patient has been diagnosed with DM with no foot complications. The patient presents for preventative foot care services. No changes to ROS.  Patient is taking eliquiss.  Podiatric Exam: Vascular: dorsalis pedis and posterior tibial pulses are palpable bilateral. Capillary return is immediate. Temperature gradient is WNL. Skin turgor WNL  Sensorium: Normal Semmes Weinstein monofilament test. Normal tactile sensation bilaterally. Nail Exam: Pt has thick disfigured discolored nails with subungual debris noted bilateral entire nail hallux through fifth toenails Ulcer Exam: There is no evidence of ulcer or pre-ulcerative changes or infection. Orthopedic Exam: Muscle tone and strength are WNL. No limitations in general ROM. No crepitus or effusions noted. Foot type and digits show no abnormalities. Bony prominences are unremarkable. Skin: No Porokeratosis. No infection or ulcers  Diagnosis:  Onychomycosis, , Pain in right toe, pain in left toes  Treatment & Plan Procedures and Treatment: Consent by patient was obtained for treatment procedures.   Debridement of mycotic and hypertrophic toenails, 1 through 5 bilateral and clearing of subungual debris. No ulceration, no infection noted.  Return Visit-Office Procedure: Patient instructed to return to the office for a follow up visit 4 months for continued evaluation and treatment.    Yuna Pizzolato DPM 

## 2018-11-08 ENCOUNTER — Other Ambulatory Visit: Payer: Self-pay

## 2018-11-08 DIAGNOSIS — K219 Gastro-esophageal reflux disease without esophagitis: Secondary | ICD-10-CM

## 2018-11-08 MED ORDER — ESOMEPRAZOLE MAGNESIUM 40 MG PO CPDR: 40.0000 mg | DELAYED_RELEASE_CAPSULE | Freq: Every day | ORAL | 1 refills | Status: DC

## 2018-12-10 ENCOUNTER — Other Ambulatory Visit: Payer: Self-pay

## 2018-12-10 DIAGNOSIS — I480 Paroxysmal atrial fibrillation: Secondary | ICD-10-CM

## 2018-12-12 MED ORDER — APIXABAN 2.5 MG PO TABS: 2.5000 mg | ORAL_TABLET | Freq: Two times a day (BID) | ORAL | 1 refills | Status: DC

## 2018-12-17 ENCOUNTER — Other Ambulatory Visit: Payer: Self-pay

## 2018-12-17 ENCOUNTER — Other Ambulatory Visit: Payer: Medicare Other

## 2018-12-17 DIAGNOSIS — C61 Malignant neoplasm of prostate: Secondary | ICD-10-CM

## 2018-12-18 LAB — PSA: Prostate Specific Ag, Serum: <0.1 ng/mL (ref 0.0–4.0)

## 2018-12-22 ENCOUNTER — Encounter: Payer: Self-pay | Admitting: Urology

## 2018-12-22 ENCOUNTER — Other Ambulatory Visit: Payer: Self-pay

## 2018-12-22 ENCOUNTER — Ambulatory Visit (INDEPENDENT_AMBULATORY_CARE_PROVIDER_SITE_OTHER): Payer: Medicare Other | Admitting: Urology

## 2018-12-22 VITALS — BP 112/71 | HR 91 | Ht 72.0 in | Wt 214.0 lb

## 2018-12-22 DIAGNOSIS — N400 Enlarged prostate without lower urinary tract symptoms: Secondary | ICD-10-CM | POA: Diagnosis not present

## 2018-12-22 DIAGNOSIS — C61 Malignant neoplasm of prostate: Secondary | ICD-10-CM | POA: Diagnosis not present

## 2018-12-22 MED ORDER — LEUPROLIDE ACETATE (6 MONTH) 45 MG ~~LOC~~ KIT
45.0000 mg | PACK | Freq: Once | SUBCUTANEOUS | Status: AC
  Administered 2018-12-22: 45 mg via SUBCUTANEOUS

## 2018-12-22 NOTE — Progress Notes (Signed)
12/22/2018 4:00 PM   Charles Valencia 1931/10/05 [id number]  Referring provider: Arnetha Courser, MD [address] [place],  [place] [address]  Chief Complaint  Patient presents with  . Other    HPI: 85 year old male with high risk clinically symptomatic prostate cancer on ADT only.  He returns today for 31-month follow-up with PSA prior.  PSA remains undetectable.  He has been tolerating ADT without any issues.  No real issues with hot flashes.  His urinary symptoms remain stable and well controlled.  He now feels like he is emptying well and he has no complaints.  He is no longer taking Flomax as he feels like he does not need this medication anymore.  History Ms. Charles Valencia feels that he is able to empty without difficulty.   Prostate cancer history: He initially presented in 10/2016 with elevated PSA to 16.9.PSA was checked by his PCP on 09/10/2016 and markedly elevated to 16.9. Last known PSA in 2012 8.4.Rectal exam did show an enlarged gland which was massively enlarged with induration on the right lateral ridge without a discrete nodule although the exam was somewhat limited to habitus.PSA was repeated on 01/12/2017 aroseto 35.7.  Patient was extremely hesitant to undergo prostate biopsy due to concern for the enema thus this was delayed until 04/15/2017. Ultimately prostate biopsy on this day revealed high risk prostate cancer,Gleason 4+5 up to 100% of the tissue including allbiopsiesright side of the biopsy in 2 of the left.TRUS vol 53.  He underwent staging with CT scan and bone scan4/2019both of which are negative for any evidence of metastatic disease. Notably, there is borderline prominence of the seminal vesicles but otherwise no lymphadenopathy.    PMH: Past Medical History:  Diagnosis Date  . Diabetes ([place])   . GERD (gastroesophageal reflux disease)   . Glaucoma   . Hyperlipidemia   . Hypertension   . Paroxysmal atrial fibrillation ([place])      Followed by Cardiology.    Surgical History: No past surgical history on file.  Home Medications:  Allergies as of 12/22/2018   No Known Allergies     Medication List       Accurate as of December 22, 2018 11:59 PM. If you have any questions, ask your nurse or doctor.        apixaban 2.5 MG Tabs tablet Commonly known as: Eliquis Take 1 tablet (2.5 mg total) by mouth 2 (two) times daily.   aspirin 81 MG tablet Take 81 mg by mouth daily.   dextromethorphan-guaiFENesin 30-600 MG 12hr tablet Commonly known as: MUCINEX DM Take 1 tablet by mouth 2 (two) times daily.   esomeprazole 40 MG capsule Commonly known as: NEXIUM Take 1 capsule (40 mg total) by mouth daily at 12 noon.   rosuvastatin 5 MG tablet Commonly known as: CRESTOR TAKE 1 TABLET BY MOUTH DAILY   tamsulosin 0.4 MG Caps capsule Commonly known as: Flomax Take 1 capsule (0.4 mg total) by mouth daily.   traMADol 50 MG tablet Commonly known as: ULTRAM Take by mouth every 12 (twelve) hours as needed.   valsartan-hydrochlorothiazide 160-12.5 MG tablet Commonly known as: DIOVAN-HCT TAKE 1 TABLET BY MOUTH DAILY       Allergies: No Known Allergies  Family History: Family History  Problem Relation Age of Onset  . Diabetes Sister   . Hyperlipidemia Sister   . Hypertension Sister   . Kidney disease Sister   . Alzheimer's disease Sister   . Bladder Cancer Neg Hx   .  Kidney cancer Neg Hx   . AAA (abdominal aortic aneurysm) Neg Hx     Social History:  reports that he has quit smoking. His smoking use included cigarettes. He has never used smokeless tobacco. He reports that he does not drink alcohol or use drugs.  ROS: UROLOGY Frequent Urination?: Yes Hard to postpone urination?: No Burning/pain with urination?: No Get up at night to urinate?: Yes Leakage of urine?: Yes Urine stream starts and stops?: No Trouble starting stream?: No Do you have to strain to urinate?: No Blood in urine?:  No Urinary tract infection?: No Sexually transmitted disease?: No Injury to kidneys or bladder?: No Painful intercourse?: No Weak stream?: No Erection problems?: No Penile pain?: No  Gastrointestinal Nausea?: No Vomiting?: No Indigestion/heartburn?: No Diarrhea?: No Constipation?: No  Constitutional Fever: No Night sweats?: No Weight loss?: No Fatigue?: No  Skin Skin rash/lesions?: No Itching?: No  Eyes Blurred vision?: No Double vision?: No  Ears/Nose/Throat Sore throat?: No Sinus problems?: No  Hematologic/Lymphatic Swollen glands?: No Easy bruising?: No  Cardiovascular Leg swelling?: No Chest pain?: No  Respiratory Cough?: No Shortness of breath?: No  Endocrine Excessive thirst?: No  Musculoskeletal Back pain?: No Joint pain?: No  Neurological Headaches?: No Dizziness?: No  Psychologic Depression?: No Anxiety?: No  Physical Exam: BP 112/71   Pulse 91   Ht 6' (1.829 m)   Wt 214 lb (97.1 kg)   BMI 29.02 kg/m   Constitutional:  Alert and oriented, No acute distress. HEENT: [place] AT, moist mucus membranes.  Trachea midline, no masses. Cardiovascular: No clubbing, cyanosis, or edema. Respiratory: Normal respiratory effort, no increased work of breathing. GI: Abdomen is soft, nontender, nondistended, no abdominal masses GU: No CVA tenderness Lymph: No cervical or inguinal lymphadenopathy. Skin: No rashes, bruises or suspicious lesions. Neurologic: Grossly intact, no focal deficits, moving all 4 extremities. Psychiatric: Normal mood and affect.  Laboratory Data: Component     Latest Ref Rng & Units 01/12/2017 06/10/2017 10/20/2017 06/18/2018  Prostate Specific Ag, Serum     0.0 - 4.0 ng/mL 35.7 (H) 0.8 <0.1 <0.1   Component     Latest Ref Rng & Units 12/17/2018  Prostate Specific Ag, Serum     0.0 - 4.0 ng/mL <0.1   \ Assessment & Plan:    1. Prostate cancer ([place]) High risk locally advanced prostate cancer on ADT only for symptom  control given age and comorbidities  Tolerating ADT without difficulty, 45-month leuprolide given today in the form of Eligard  Reviewed bone health and weightbearing exercise recommendations.  Plan to continue ADT indefinitely, although may consider intermittent ADT down the road  - leuprolide (6 Month) (ELIGARD) injection 45 mg  2. Benign prostatic hyperplasia without lower urinary tract symptoms Previously with poorly controlled urinary symptoms on Flomax, however after treatment of ADT, his urinary symptoms have essentially resolved  No longer taking Flomax  I have recommended that he hang onto this medication resume as needed.  Return in about 6 months (around 06/22/2019) for PSA/ ADT.  [name], MD  [facility] [address] [place], [place] [address] [phone]

## 2018-12-23 ENCOUNTER — Encounter: Payer: Self-pay | Admitting: Urology

## 2019-01-25 ENCOUNTER — Other Ambulatory Visit: Payer: Self-pay

## 2019-01-25 ENCOUNTER — Encounter: Payer: Self-pay | Admitting: Family Medicine

## 2019-01-25 ENCOUNTER — Ambulatory Visit (INDEPENDENT_AMBULATORY_CARE_PROVIDER_SITE_OTHER): Payer: Medicare Other | Admitting: Family Medicine

## 2019-01-25 VITALS — BP 138/78 | HR 68 | Temp 97.6°F | Resp 16 | Ht 72.0 in | Wt 214.8 lb

## 2019-01-25 DIAGNOSIS — I480 Paroxysmal atrial fibrillation: Secondary | ICD-10-CM

## 2019-01-25 DIAGNOSIS — C61 Malignant neoplasm of prostate: Secondary | ICD-10-CM

## 2019-01-25 DIAGNOSIS — K219 Gastro-esophageal reflux disease without esophagitis: Secondary | ICD-10-CM | POA: Diagnosis not present

## 2019-01-25 DIAGNOSIS — E119 Type 2 diabetes mellitus without complications: Secondary | ICD-10-CM | POA: Diagnosis not present

## 2019-01-25 DIAGNOSIS — I10 Essential (primary) hypertension: Secondary | ICD-10-CM | POA: Diagnosis not present

## 2019-01-25 DIAGNOSIS — H6123 Impacted cerumen, bilateral: Secondary | ICD-10-CM

## 2019-01-25 DIAGNOSIS — E782 Mixed hyperlipidemia: Secondary | ICD-10-CM | POA: Diagnosis not present

## 2019-01-25 DIAGNOSIS — D689 Coagulation defect, unspecified: Secondary | ICD-10-CM

## 2019-01-25 NOTE — Progress Notes (Signed)
Name: Charles Valencia   MRN: [id number]    DOB: [birth year]   Date:01/25/2019       Progress Note  Subjective  Chief Complaint  Chief Complaint  Patient presents with  . Hypertension    4 month follow up  . Hyperlipidemia  . Ear Fullness    HPI  Hypertension: Patient takes valsartan-HCTZ 160-12.5mg  daily. Denies chest pain, dizziness, lightheadedness or headaches; no BLE edema.   Hyperlipidemia: Rosuvastatin 5mg  nightly; No myalgias; wants to remain on medication for now.  States eats a lot of fish, beans, vegetables, occasional pork beans.  No changes, stable.  GERD: Patient states is well controlled on nexium. No abdominal pain, difficulty swallowing, or regurgitation.  Does not want to wean office.   Prostate Cancer: Patient states gets up once at night usually, stopped flomax because he didn't think it was helping - symptoms improved after ADT. Is doing well on this. Seeing Dr. Erlene Quan with Urology for management, last visit was in December 2020, recommended 6 month follow up.  Atrial fib: Takes eliquis and aspirin. Denies easy bruising or blood in urine, stools. Denies palpitations, shortness of breath  No changes today, feels well overall.  Not on rate control, HR today at goal.  Diabetes: Patient not on antidiabetic medications, watches diet. On statin and ARB/ denies polydipsia or polyphaiga.Has some polyuria due to enlarged prostate.  Ear fullness: Gets cerumen impaction every now and then, has noticed more fullness over the last few months.  Will evaluate today.  No dizziness, lightheadedness, no changes in hearing.  Patient Active Problem List   Diagnosis Date Noted  . Coagulation disorder ([place]) 06/17/2018  . Type 2 diabetes mellitus ([place]) 04/27/2017  . Prostate cancer ([place]) 09/12/2016  . GERD (gastroesophageal reflux disease) 09/13/2015  . Hyperlipidemia 09/13/2015  . Atrial fibrillation ([place]) 10/17/2014  . Bradycardia 06/14/2014  . BP (high blood pressure)  06/14/2014  . Sick sinus syndrome ([place]) 06/14/2014    No past surgical history on file.  Family History  Problem Relation Age of Onset  . Diabetes Sister   . Hyperlipidemia Sister   . Hypertension Sister   . Kidney disease Sister   . Alzheimer's disease Sister   . Bladder Cancer Neg Hx   . Kidney cancer Neg Hx   . AAA (abdominal aortic aneurysm) Neg Hx     Social History   Socioeconomic History  . Marital status: Divorced    Spouse name: Not on file  . Number of children: 0  . Years of education: Not on file  . Highest education level: 4th grade  Occupational History  . Occupation: Retired  Tobacco Use  . Smoking status: Former Smoker    Types: Cigarettes  . Smokeless tobacco: Never Used  . Tobacco comment: smoking cessation materials not required; poor historian, unable to recall smoking history  Substance and Sexual Activity  . Alcohol use: No    Alcohol/week: 0.0 standard drinks  . Drug use: No  . Sexual activity: Not Currently  Other Topics Concern  . Not on file  Social History Narrative  . Not on file   Social Determinants of Health   Financial Resource Strain:   . Difficulty of Paying Living Expenses: Not on file  Food Insecurity:   . Worried About Charity fundraiser in the Last Year: Not on file  . Ran Out of Food in the Last Year: Not on file  Transportation Needs:   . Lack of Transportation (Medical): Not on file  .  Lack of Transportation (Non-Medical): Not on file  Physical Activity:   . Days of Exercise per Week: Not on file  . Minutes of Exercise per Session: Not on file  Stress:   . Feeling of Stress : Not on file  Social Connections: Somewhat Isolated  . Frequency of Communication with Friends and Family: More than three times a week  . Frequency of Social Gatherings with Friends and Family: Twice a week  . Attends Religious Services: More than 4 times per year  . Active Member of Clubs or Organizations: No  . Attends Archivist  Meetings: Never  . Marital Status: Divorced  Human resources officer Violence:   . Fear of Current or Ex-Partner: Not on file  . Emotionally Abused: Not on file  . Physically Abused: Not on file  . Sexually Abused: Not on file     Current Outpatient Medications:  .  apixaban (ELIQUIS) 2.5 MG TABS tablet, Take 1 tablet (2.5 mg total) by mouth 2 (two) times daily., Disp: 180 tablet, Rfl: 1 .  aspirin 81 MG tablet, Take 81 mg by mouth daily., Disp: , Rfl:  .  dextromethorphan-guaiFENesin (MUCINEX DM) 30-600 MG 12hr tablet, Take 1 tablet by mouth 2 (two) times daily., Disp: 30 tablet, Rfl: 0 .  esomeprazole (NEXIUM) 40 MG capsule, Take 1 capsule (40 mg total) by mouth daily at 12 noon., Disp: 90 capsule, Rfl: 1 .  rosuvastatin (CRESTOR) 5 MG tablet, TAKE 1 TABLET BY MOUTH DAILY, Disp: 90 tablet, Rfl: 1 .  tamsulosin (FLOMAX) 0.4 MG CAPS capsule, Take 1 capsule (0.4 mg total) by mouth daily., Disp: 30 capsule, Rfl: 11 .  traMADol (ULTRAM) 50 MG tablet, Take by mouth every 12 (twelve) hours as needed., Disp: , Rfl:  .  valsartan-hydrochlorothiazide (DIOVAN-HCT) 160-12.5 MG tablet, TAKE 1 TABLET BY MOUTH DAILY, Disp: 90 tablet, Rfl: 1  No Known Allergies  I personally reviewed active problem list, medication list, allergies, notes from last encounter, lab results with the patient/caregiver today.   ROS  Constitutional: Negative for fever or weight change.  Respiratory: Negative for cough and shortness of breath.   Cardiovascular: Negative for chest pain or palpitations.  Gastrointestinal: Negative for abdominal pain, no bowel changes.  Musculoskeletal: Negative for gait problem or joint swelling.  Skin: Negative for rash.  Neurological: Negative for dizziness or headache.  No other specific complaints in a complete review of systems (except as listed in HPI above).   Objective  Vitals:   01/25/19 1113  BP: 138/78  Pulse: 68  Resp: 16  Temp: 97.6 F (36.4 C)  TempSrc: Temporal  SpO2:  99%  Weight: 214 lb 12.8 oz (97.4 kg)  Height: 6' (1.829 m)    Body mass index is 29.13 kg/m.  Physical Exam Constitutional: Patient appears well-developed and well-nourished. No distress.  HENT: Head: Normocephalic and atraumatic. Ears: bilateral TMs with no erythema or effusion after cerumen impaction is removed bilaterally; Nose: Nose normal. Mouth/Throat: Oropharynx is clear and moist. No oropharyngeal exudate or tonsillar swelling.  Eyes: Conjunctivae and EOM are normal. No scleral icterus.  Neck: Normal range of motion. Neck supple. No JVD present. Cardiovascular: Normal rate, regular rhythm and normal heart sounds.  No murmur heard. No BLE edema. Pulmonary/Chest: Effort normal and breath sounds normal. No respiratory distress. Musculoskeletal: Normal range of motion, no joint effusions. No gross deformities Neurological: Pt is alert and oriented to person, place, and time. No cranial nerve deficit. Coordination, balance, strength, speech and gait are normal.  Skin: Skin is warm and dry. No rash noted. No erythema.  Psychiatric: Patient has a normal mood and affect. behavior is normal. Judgment and thought content normal.   No results found for this or any previous visit (from the past 72 hour(s)).   PHQ2/9: Depression screen [facility] 2/9 01/25/2019 10/07/2018 07/05/2018 06/01/2018 05/07/2018  Decreased Interest 0 0 0 0 0  Down, Depressed, Hopeless 0 0 0 0 0  PHQ - 2 Score 0 0 0 0 0  Altered sleeping 0 0 0 1 0  Tired, decreased energy 0 0 0 0 0  Change in appetite 0 0 0 0 0  Feeling bad or failure about yourself  0 0 0 0 0  Trouble concentrating 0 0 0 0 0  Moving slowly or fidgety/restless 0 0 0 0 0  Suicidal thoughts 0 0 0 0 0  PHQ-9 Score 0 0 0 1 0  Difficult doing work/chores Not difficult at all Not difficult at all Not difficult at all Not difficult at all Not difficult at all  Some recent data might be hidden   PHQ-2/9 Result is negative.    Fall Risk: Fall Risk  01/25/2019  10/07/2018 07/05/2018 06/01/2018 05/07/2018  Falls in the past year? 0 0 0 0 0  Number falls in past yr: 0 0 0 0 0  Injury with Fall? 0 0 0 0 0  Comment - - - - -  Risk for fall due to : - - - - -  Risk for fall due to: Comment - - - - -  Follow up Falls evaluation completed - - Falls prevention discussed Falls evaluation completed   Assessment & Plan  1. Essential hypertension - Stable on current regimen; labs UTD  2. Mixed hyperlipidemia - Wants to continue crestor, will continue this today.  3. Gastroesophageal reflux disease, unspecified whether esophagitis present - Has tried coming off of nexium in the past without success, will maintain, stable.  4. Prostate cancer (HCC) - Seeing Dr. Erlene Quan  5. Paroxysmal atrial fibrillation (HCC) - On Eliquis, no rate control; rhythm is regular today  6. Coagulation disorder ([place]) - On Eliquis  7. Type 2 diabetes mellitus without complication, without long-term current use of insulin (HCC) - Diet controlled; due for urine micro today - Microalbumin / creatinine urine ratio  8. Bilateral impacted cerumen - Removed cerumen, TM's WNL.

## 2019-01-26 ENCOUNTER — Encounter: Payer: Self-pay | Admitting: Family Medicine

## 2019-01-26 LAB — MICROALBUMIN / CREATININE URINE RATIO
Creatinine, Urine: 133 mg/dL (ref 20–320)
Microalb Creat Ratio: 41 mcg/mg creat — ABNORMAL HIGH (ref <30)
Microalb, Ur: 5.5 mg/dL

## 2019-02-21 ENCOUNTER — Ambulatory Visit: Payer: Medicare Other | Attending: Internal Medicine

## 2019-02-21 DIAGNOSIS — Z23 Encounter for immunization: Secondary | ICD-10-CM

## 2019-02-21 NOTE — Progress Notes (Signed)
   Covid-19 Vaccination Clinic  Name:  [name]    MRN: [id number] DOB: 12/17/1931  02/21/2019  [name] was observed post Covid-19 immunization for 15 minutes without incidence. He was provided with Vaccine Information Sheet and instruction to access the V-Safe system.   [name] was instructed to call 911 with any severe reactions post vaccine: Marland Kitchen Difficulty breathing  . Swelling of your face and throat  . A fast heartbeat  . A bad rash all over your body  . Dizziness and weakness    Immunizations Administered    Name Date Dose VIS Date Route   Pfizer COVID-19 Vaccine 02/21/2019  9:29 AM 0.3 mL 12/17/2018 Intramuscular   Manufacturer: [place]   Lot: [id number]   [place]: [id number]

## 2019-02-24 ENCOUNTER — Ambulatory Visit: Payer: Medicare Other | Admitting: Podiatry

## 2019-03-03 ENCOUNTER — Other Ambulatory Visit: Payer: Self-pay | Admitting: Family Medicine

## 2019-03-03 NOTE — Telephone Encounter (Signed)
Not a patient at [facility]. Routed to wrong pool.

## 2019-03-03 NOTE — Telephone Encounter (Signed)
Requested medication (s) are due for refill today:   Yes  Requested medication (s) are on the active medication list:   Yes  Future visit scheduled:   Yes   Last ordered: 09/08/2018  #90 1 refill  Clinic note:  Returned because labs due.   Requested Prescriptions  Pending Prescriptions Disp Refills   valsartan-hydrochlorothiazide (DIOVAN-HCT) 160-12.5 MG tablet [Pharmacy Med Name: VALSARTAN-HCTZ 160-12.5 MG TAB] 90 tablet 1    Sig: TAKE 1 TABLET BY MOUTH DAILY      Cardiovascular: ARB + Diuretic Combos Failed - 03/03/2019  1:31 PM      Failed - K in normal range and within 180 days    Potassium  Date Value Ref Range Status  10/27/2017 3.5 3.5 - 5.3 mmol/L Final  11/17/2013 3.9 3.5 - 5.1 mmol/L Final          Failed - Na in normal range and within 180 days    Sodium  Date Value Ref Range Status  10/27/2017 140 135 - 146 mmol/L Final  04/23/2015 141 134 - 144 mmol/L Final          Failed - Cr in normal range and within 180 days    Creat  Date Value Ref Range Status  10/27/2017 0.96 0.70 - 1.11 mg/dL Final    Comment:    For patients >28 years of age, the reference limit for Creatinine is approximately 13% higher for people identified as African-American. .    Creatinine, Urine  Date Value Ref Range Status  01/25/2019 133 20 - 320 mg/dL Final          Failed - Ca in normal range and within 180 days    Calcium  Date Value Ref Range Status  10/27/2017 9.7 8.6 - 10.3 mg/dL Final          Passed - Patient is not pregnant      Passed - Last BP in normal range    BP Readings from Last 1 Encounters:  01/25/19 138/78          Passed - Valid encounter within last 6 months    Recent Outpatient Visits           1 month ago Essential hypertension   [place], FNP   4 months ago Essential hypertension   [place], FNP   8 months ago Cough   [place], NP   10 months ago Paroxysmal atrial fibrillation [facility])   [place], NP   10 months ago Erroneous encounter - disregard   [place], Satira Anis, MD       Future Appointments             In 3 months  [facility], [place]   In 3 months [name], MD [place]   In 4 months [name], [facility], [facility]

## 2019-03-05 NOTE — Telephone Encounter (Signed)
This patient of Emily's was last seen Jan, 2021. I reviewed that note. She noted then labs UTD. He had labs ordered Oct 2020 from her prior visit and not yet completed per Epic review, with last labs done in Oct 2019 (of kidney function, electrolytes which are needed with BP med requesting refilled).  Please have patient come to the office and get the labs done that Raquel Sarna ordered in October, with those orders still active.  I will refill the medication if will run out before he can get the labs done, as feel more concerning if just stops taking, and please let me know if that is the case. Please emphasize the importance of getting these labs done this coming week.  Thanks,  [facility]

## 2019-03-07 ENCOUNTER — Other Ambulatory Visit: Payer: Self-pay | Admitting: Internal Medicine

## 2019-03-07 MED ORDER — VALSARTAN-HYDROCHLOROTHIAZIDE 160-12.5 MG PO TABS: 1.0000 | ORAL_TABLET | Freq: Every day | ORAL | 1 refills | Status: DC

## 2019-03-07 NOTE — Progress Notes (Signed)
Refilled BP med. Will be getting labs done this Wed.

## 2019-03-07 NOTE — Telephone Encounter (Signed)
Spoke to patient and family they will bring him on Wednesday for labs

## 2019-03-22 ENCOUNTER — Ambulatory Visit: Payer: Medicare Other | Attending: Internal Medicine

## 2019-03-22 DIAGNOSIS — Z23 Encounter for immunization: Secondary | ICD-10-CM

## 2019-03-22 NOTE — Progress Notes (Signed)
   Covid-19 Vaccination Clinic  Name:  Charles Valencia    MRN: [id number] DOB: 12-06-1931  03/22/2019  [name] was observed post Covid-19 immunization for 15 minutes without incident. He was provided with Vaccine Information Sheet and instruction to access the V-Safe system.   [name] was instructed to call 911 with any severe reactions post vaccine: Marland Kitchen Difficulty breathing  . Swelling of face and throat  . A fast heartbeat  . A bad rash all over body  . Dizziness and weakness   Immunizations Administered    Name Date Dose VIS Date Route   Pfizer COVID-19 Vaccine 03/22/2019 11:08 AM 0.3 mL 12/17/2018 Intramuscular   Manufacturer: [place]   Lot: [id number]   [place]: [id number]

## 2019-03-31 ENCOUNTER — Other Ambulatory Visit: Payer: Self-pay

## 2019-03-31 ENCOUNTER — Ambulatory Visit (INDEPENDENT_AMBULATORY_CARE_PROVIDER_SITE_OTHER): Payer: Medicare Other | Admitting: Podiatry

## 2019-03-31 ENCOUNTER — Encounter: Payer: Self-pay | Admitting: Podiatry

## 2019-03-31 VITALS — Temp 97.0°F

## 2019-03-31 DIAGNOSIS — M79674 Pain in right toe(s): Secondary | ICD-10-CM

## 2019-03-31 DIAGNOSIS — E119 Type 2 diabetes mellitus without complications: Secondary | ICD-10-CM | POA: Diagnosis not present

## 2019-03-31 DIAGNOSIS — B351 Tinea unguium: Secondary | ICD-10-CM

## 2019-03-31 DIAGNOSIS — D689 Coagulation defect, unspecified: Secondary | ICD-10-CM

## 2019-03-31 DIAGNOSIS — M79675 Pain in left toe(s): Secondary | ICD-10-CM

## 2019-03-31 NOTE — Progress Notes (Signed)
This patient returns to my office for at risk foot care.  This patient requires this care by a professional since this patient will be at risk due to having diabetes,chronic kidney disease and coagulation defect.  Patient is taking eliquiss.  This patient is unable to cut nails himself since the patient cannot reach his nails.These nails are painful walking and wearing shoes.  This patient presents for at risk foot care today.  General Appearance  Alert, conversant and in no acute stress.  Vascular  Dorsalis pedis and posterior tibial  pulses are palpable  bilaterally.  Capillary return is within normal limits  bilaterally. Temperature is within normal limits  bilaterally.  Neurologic  Senn-Weinstein monofilament wire test within normal limits  bilaterally. Muscle power within normal limits bilaterally.  Nails Thick disfigured discolored nails with subungual debris  from hallux to fifth toes bilaterally. No evidence of bacterial infection or drainage bilaterally.  Orthopedic  No limitations of motion  feet .  No crepitus or effusions noted.  No bony pathology or digital deformities noted.  Skin  normotropic skin with no porokeratosis noted bilaterally.  No signs of infections or ulcers noted.     Onychomycosis  Pain in right toes  Pain in left toes  Consent was obtained for treatment procedures.   Mechanical debridement of nails 1-5  bilaterally performed with a nail nipper.  Filed with dremel without incident.    Return office visit     4 months                Told patient to return for periodic foot care and evaluation due to potential at risk complications.   Param Capri DPM  

## 2019-04-11 ENCOUNTER — Other Ambulatory Visit: Payer: Self-pay | Admitting: Family Medicine

## 2019-04-11 DIAGNOSIS — K219 Gastro-esophageal reflux disease without esophagitis: Secondary | ICD-10-CM

## 2019-04-11 NOTE — Telephone Encounter (Signed)
Requested Prescriptions  Pending Prescriptions Disp Refills  . esomeprazole (NEXIUM) 40 MG capsule [Pharmacy Med Name: ESOMEPRAZOLE MAGNESIUM 40 MG CAP] 90 capsule 1    Sig: TAKE 1 CAPSULE BY MOUTH DAILY AT 12 NOON     Gastroenterology: Proton Pump Inhibitors Passed - 04/11/2019  1:39 PM      Passed - Valid encounter within last 12 months    Recent Outpatient Visits          2 months ago Essential hypertension   [place], [place], FNP   6 months ago Essential hypertension   [place], FNP   9 months ago Cough   [place], NP   11 months ago Paroxysmal atrial fibrillation [facility])   [place], NP   11 months ago Erroneous encounter - disregard   [facility] [place], Satira Anis, MD      Future Appointments            In 1 month  [facility], [place]   In 2 months [name], MD [place]   In 3 months [name], [facility], [facility]

## 2019-04-18 DIAGNOSIS — E782 Mixed hyperlipidemia: Secondary | ICD-10-CM | POA: Diagnosis not present

## 2019-04-18 DIAGNOSIS — I495 Sick sinus syndrome: Secondary | ICD-10-CM | POA: Diagnosis not present

## 2019-04-18 DIAGNOSIS — I482 Chronic atrial fibrillation, unspecified: Secondary | ICD-10-CM | POA: Diagnosis not present

## 2019-04-18 DIAGNOSIS — R0602 Shortness of breath: Secondary | ICD-10-CM | POA: Diagnosis not present

## 2019-04-18 DIAGNOSIS — I10 Essential (primary) hypertension: Secondary | ICD-10-CM | POA: Diagnosis not present

## 2019-05-13 ENCOUNTER — Telehealth: Payer: Self-pay | Admitting: Family Medicine

## 2019-05-13 NOTE — Chronic Care Management (AMB) (Signed)
  Chronic Care Management   Outreach Note  05/13/2019 Name: Charles Valencia MRN: [id number] DOB: [birth year]  Charles Valencia is a 86 y.o. year old male who is a primary care patient of [name], FNP. I reached out to Charles Valencia by phone today in response to a referral sent by Charles Valencia's health plan.     An unsuccessful telephone outreach was attempted today. The patient was referred to the case management team for assistance with care management and care coordination.   Follow Up Plan: The care management team will reach out to the patient again over the next 7 days.  If patient returns call to provider office, please advise to call Charles Valencia at [place], [place], [place], [place] [address] Direct Dial: [phone] [email or web address] Website: [email or web address]

## 2019-05-16 NOTE — Chronic Care Management (AMB) (Signed)
  Chronic Care Management   Note  05/16/2019 Name: Trumaine Wimer MRN: [id number] DOB: 1931/04/24  Avaneesh Pepitone is a 86 y.o. year old male who is a primary care patient of [name], FNP. I reached out to [name] by phone today in response to a referral sent by Mr. Caellum Michalik's health plan.     Mr. Schellhase was given information about Chronic Care Management services today including:  1. CCM service includes personalized support from designated clinical staff supervised by his physician, including individualized plan of care and coordination with other care providers 2. 24/7 contact phone numbers for assistance for urgent and routine care needs. 3. Service will only be billed when office clinical staff spend 20 minutes or more in a month to coordinate care. 4. Only one practitioner may furnish and bill the service in a calendar month. 5. The patient may stop CCM services at any time (effective at the end of the month) by phone call to the office staff. 6. The patient will be responsible for cost sharing (co-pay) of up to 20% of the service fee (after annual deductible is met).  Patient agreed to services and verbal consent obtained.   Follow up plan: Telephone appointment with care management team member scheduled for: 06/15/2019 Noreene Larsson, [place], [place], [place] [address] Direct Dial: [phone] [email or web address]: [email or web address]

## 2019-05-24 DIAGNOSIS — I482 Chronic atrial fibrillation, unspecified: Secondary | ICD-10-CM | POA: Diagnosis not present

## 2019-05-24 DIAGNOSIS — R0602 Shortness of breath: Secondary | ICD-10-CM | POA: Diagnosis not present

## 2019-06-02 ENCOUNTER — Ambulatory Visit (INDEPENDENT_AMBULATORY_CARE_PROVIDER_SITE_OTHER): Payer: Medicare Other

## 2019-06-02 DIAGNOSIS — Z0000 Encounter for general adult medical examination without abnormal findings: Secondary | ICD-10-CM

## 2019-06-02 NOTE — Patient Instructions (Signed)
Charles Valencia , Thank you for taking time to come for your Medicare Wellness Visit. I appreciate your ongoing commitment to your health goals. Please review the following plan we discussed and let me know if I can assist you in the future.   Screening recommendations/referrals: Colonoscopy: no longer required Recommended yearly ophthalmology/optometry visit for glaucoma screening and checkup Recommended yearly dental visit for hygiene and checkup  Vaccinations: Influenza vaccine: done 10/13/18 Pneumococcal vaccine: done 09/10/16 Tdap vaccine: done 12/16/11 Shingles vaccine: Shingrix discussed. Please contact your pharmacy for coverage information.  Covid-19: done 02/21/19 & 03/22/19  Advanced directives: Advance directive discussed with you today. Even though you declined this today please call our office should you change your mind and we can give you the proper paperwork for you to fill out.  Conditions/risks identified: Recommend drinking 6-8 glasses of water per day  Next appointment: Please follow up in one year for your Medicare Annual Wellness visit.    Preventive Care 84 Years and Older, Male Preventive care refers to lifestyle choices and visits with your health care provider that can promote health and wellness. What does preventive care include?  A yearly physical exam. This is also called an annual well check.  Dental exams once or twice a year.  Routine eye exams. Ask your health care provider how often you should have your eyes checked.  Personal lifestyle choices, including:  Daily care of your teeth and gums.  Regular physical activity.  Eating a healthy diet.  Avoiding tobacco and drug use.  Limiting alcohol use.  Practicing safe sex.  Taking low doses of aspirin every day.  Taking vitamin and mineral supplements as recommended by your health care provider. What happens during an annual well check? The services and screenings done by your health care  provider during your annual well check will depend on your age, overall health, lifestyle risk factors, and family history of disease. Counseling  Your health care provider may ask you questions about your:  Alcohol use.  Tobacco use.  Drug use.  Emotional well-being.  Home and relationship well-being.  Sexual activity.  Eating habits.  History of falls.  Memory and ability to understand (cognition).  Work and work Statistician. Screening  You may have the following tests or measurements:  Height, weight, and BMI.  Blood pressure.  Lipid and cholesterol levels. These may be checked every 5 years, or more frequently if you are over 71 years old.  Skin check.  Lung cancer screening. You may have this screening every year starting at age 84 if you have a 30-pack-year history of smoking and currently smoke or have quit within the past 15 years.  Fecal occult blood test (FOBT) of the stool. You may have this test every year starting at age 71.  Flexible sigmoidoscopy or colonoscopy. You may have a sigmoidoscopy every 5 years or a colonoscopy every 10 years starting at age 72.  Prostate cancer screening. Recommendations will vary depending on your family history and other risks.  Hepatitis C blood test.  Hepatitis B blood test.  Sexually transmitted disease (STD) testing.  Diabetes screening. This is done by checking your blood sugar (glucose) after you have not eaten for a while (fasting). You may have this done every 1-3 years.  Abdominal aortic aneurysm (AAA) screening. You may need this if you are a current or former smoker.  Osteoporosis. You may be screened starting at age 84 if you are at high risk. Talk with your health care provider  about your test results, treatment options, and if necessary, the need for more tests. Vaccines  Your health care provider may recommend certain vaccines, such as:  Influenza vaccine. This is recommended every year.  Tetanus,  diphtheria, and acellular pertussis (Tdap, Td) vaccine. You may need a Td booster every 10 years.  Zoster vaccine. You may need this after age 9.  Pneumococcal 13-valent conjugate (PCV13) vaccine. One dose is recommended after age 84.  Pneumococcal polysaccharide (PPSV23) vaccine. One dose is recommended after age 84. Talk to your health care provider about which screenings and vaccines you need and how often you need them. This information is not intended to replace advice given to you by your health care provider. Make sure you discuss any questions you have with your health care provider. Document Released: 01/19/2015 Document Revised: 09/12/2015 Document Reviewed: 10/24/2014 Elsevier Interactive Patient Education  2017 [place] Prevention in the Home Falls can cause injuries. They can happen to people of all ages. There are many things you can do to make your home safe and to help prevent falls. What can I do on the outside of my home?  Regularly fix the edges of walkways and driveways and fix any cracks.  Remove anything that might make you trip as you walk through a door, such as a raised step or threshold.  Trim any bushes or trees on the path to your home.  Use bright outdoor lighting.  Clear any walking paths of anything that might make someone trip, such as rocks or tools.  Regularly check to see if handrails are loose or broken. Make sure that both sides of any steps have handrails.  Any raised decks and porches should have guardrails on the edges.  Have any leaves, snow, or ice cleared regularly.  Use sand or salt on walking paths during winter.  Clean up any spills in your garage right away. This includes oil or grease spills. What can I do in the bathroom?  Use night lights.  Install grab bars by the toilet and in the tub and shower. Do not use towel bars as grab bars.  Use non-skid mats or decals in the tub or shower.  If you need to sit down in  the shower, use a plastic, non-slip stool.  Keep the floor dry. Clean up any water that spills on the floor as soon as it happens.  Remove soap buildup in the tub or shower regularly.  Attach bath mats securely with double-sided non-slip rug tape.  Do not have throw rugs and other things on the floor that can make you trip. What can I do in the bedroom?  Use night lights.  Make sure that you have a light by your bed that is easy to reach.  Do not use any sheets or blankets that are too big for your bed. They should not hang down onto the floor.  Have a firm chair that has side arms. You can use this for support while you get dressed.  Do not have throw rugs and other things on the floor that can make you trip. What can I do in the kitchen?  Clean up any spills right away.  Avoid walking on wet floors.  Keep items that you use a lot in easy-to-reach places.  If you need to reach something above you, use a strong step stool that has a grab bar.  Keep electrical cords out of the way.  Do not use floor polish or  wax that makes floors slippery. If you must use wax, use non-skid floor wax.  Do not have throw rugs and other things on the floor that can make you trip. What can I do with my stairs?  Do not leave any items on the stairs.  Make sure that there are handrails on both sides of the stairs and use them. Fix handrails that are broken or loose. Make sure that handrails are as long as the stairways.  Check any carpeting to make sure that it is firmly attached to the stairs. Fix any carpet that is loose or worn.  Avoid having throw rugs at the top or bottom of the stairs. If you do have throw rugs, attach them to the floor with carpet tape.  Make sure that you have a light switch at the top of the stairs and the bottom of the stairs. If you do not have them, ask someone to add them for you. What else can I do to help prevent falls?  Wear shoes that:  Do not have high  heels.  Have rubber bottoms.  Are comfortable and fit you well.  Are closed at the toe. Do not wear sandals.  If you use a stepladder:  Make sure that it is fully opened. Do not climb a closed stepladder.  Make sure that both sides of the stepladder are locked into place.  Ask someone to hold it for you, if possible.  Clearly mark and make sure that you can see:  Any grab bars or handrails.  First and last steps.  Where the edge of each step is.  Use tools that help you move around (mobility aids) if they are needed. These include:  Canes.  Walkers.  Scooters.  Crutches.  Turn on the lights when you go into a dark area. Replace any light bulbs as soon as they burn out.  Set up your furniture so you have a clear path. Avoid moving your furniture around.  If any of your floors are uneven, fix them.  If there are any pets around you, be aware of where they are.  Review your medicines with your doctor. Some medicines can make you feel dizzy. This can increase your chance of falling. Ask your doctor what other things that you can do to help prevent falls. This information is not intended to replace advice given to you by your health care provider. Make sure you discuss any questions you have with your health care provider. Document Released: 10/19/2008 Document Revised: 05/31/2015 Document Reviewed: 01/27/2014 Elsevier Interactive Patient Education  2017 [employer].

## 2019-06-02 NOTE — Progress Notes (Signed)
Subjective:   Charles Valencia is a 86 y.o. male who presents for Medicare Annual/Subsequent preventive examination.  Virtual Visit via Telephone Note  I connected with  Onesimo Savino on 06/02/19 at 10:00 AM EDT by telephone and verified that I am speaking with the correct person using two identifiers.  Medicare Annual Wellness visit completed telephonically due to Covid-19 pandemic.   Location: Patient: home Provider: office   I discussed the limitations, risks, security and privacy concerns of performing an evaluation and management service by telephone and the availability of in person appointments. The patient expressed understanding and agreed to proceed.  Unable to perform video visit due to patient does not have video capability.   Some vital signs may be absent or patient reported.   Clemetine Marker, LPN    Review of Systems:   Cardiac Risk Factors include: advanced age (>67men, >40 women);male gender;dyslipidemia;diabetes mellitus;hypertension     Objective:    Vitals: There were no vitals taken for this visit.  There is no height or weight on file to calculate BMI.  Advanced Directives 06/01/2018 05/28/2017 09/29/2016 09/16/2016 09/10/2016 07/02/2016 05/01/2016  Does Patient Have a Medical Advance Directive? No No No No No No No  Would patient like information on creating a medical advance directive? No - Patient declined Yes (MAU/Ambulatory/Procedural Areas - Information given) - - - - -    Tobacco Social History   Tobacco Use  Smoking Status Former Smoker  . Types: Cigarettes  Smokeless Tobacco Never Used  Tobacco Comment   smoking cessation materials not required; poor historian, unable to recall smoking history     Counseling given: Not Answered Comment: smoking cessation materials not required; poor historian, unable to recall smoking history   Clinical Intake:  Pre-visit preparation completed: Yes  Pain : No/denies pain     Nutritional Risks:  None Diabetes: Yes CBG done?: No Did pt. bring in CBG monitor from home?: No   Nutrition Risk Assessment:  Has the patient had any N/V/D within the last 2 months?  No  Does the patient have any non-healing wounds?  No  Has the patient had any unintentional weight loss or weight gain?  No   Diabetes:  Is the patient diabetic?  Yes  If diabetic, was a CBG obtained today?  No  Did the patient bring in their glucometer from home?  No  How often do you monitor your CBG's? Pt does not check blood sugar.   Financial Strains and Diabetes Management:  Are you having any financial strains with the device, your supplies or your medication? No .  Does the patient want to be seen by Chronic Care Management for management of their diabetes?  Yes - scheduled for phone visit with CCM Pharmacist in June Would the patient like to be referred to a Nutritionist or for Diabetic Management?  No   Diabetic Exams:  Diabetic Eye Exam: Completed 06/18/17 negative retinopathy. Overdue for diabetic eye exam. Pt has been advised about the importance in completing this exam.  Diabetic Foot Exam: Completed 03/31/19.   How often do you need to have someone help you when you read instructions, pamphlets, or other written materials from your doctor or pharmacy?: 1 - Never  Interpreter Needed?: No  Information entered by :: Clemetine Marker LPN  Past Medical History:  Diagnosis Date  . Diabetes ([place])   . GERD (gastroesophageal reflux disease)   . Glaucoma   . Hyperlipidemia   . Hypertension   . Paroxysmal atrial  fibrillation ([place])    Followed by Cardiology.   History reviewed. No pertinent surgical history. Family History  Problem Relation Age of Onset  . Diabetes Sister   . Hyperlipidemia Sister   . Hypertension Sister   . Kidney disease Sister   . Alzheimer's disease Sister   . Bladder Cancer Neg Hx   . Kidney cancer Neg Hx   . AAA (abdominal aortic aneurysm) Neg Hx    Social History    Socioeconomic History  . Marital status: Divorced    Spouse name: Not on file  . Number of children: 0  . Years of education: Not on file  . Highest education level: 4th grade  Occupational History  . Occupation: Retired  Tobacco Use  . Smoking status: Former Smoker    Types: Cigarettes  . Smokeless tobacco: Never Used  . Tobacco comment: smoking cessation materials not required; poor historian, unable to recall smoking history  Substance and Sexual Activity  . Alcohol use: No    Alcohol/week: 0.0 standard drinks  . Drug use: No  . Sexual activity: Not Currently  Other Topics Concern  . Not on file  Social History Narrative   Pt lives alone, does not drive.    Social Determinants of Health   Financial Resource Strain: Low Risk   . Difficulty of Paying Living Expenses: Not hard at all  Food Insecurity: No Food Insecurity  . Worried About Charity fundraiser in the Last Year: Never true  . Ran Out of Food in the Last Year: Never true  Transportation Needs: No Transportation Needs  . Lack of Transportation (Medical): No  . Lack of Transportation (Non-Medical): No  Physical Activity: Sufficiently Active  . Days of Exercise per Week: 7 days  . Minutes of Exercise per Session: 30 min  Stress: No Stress Concern Present  . Feeling of Stress : Not at all  Social Connections: Somewhat Isolated  . Frequency of Communication with Friends and Family: More than three times a week  . Frequency of Social Gatherings with Friends and Family: Twice a week  . Attends Religious Services: More than 4 times per year  . Active Member of Clubs or Organizations: No  . Attends Archivist Meetings: Never  . Marital Status: Divorced    Outpatient Encounter Medications as of 06/02/2019  Medication Sig  . apixaban (ELIQUIS) 2.5 MG TABS tablet Take 1 tablet (2.5 mg total) by mouth 2 (two) times daily.  Marland Kitchen aspirin 81 MG tablet Take 81 mg by mouth daily.  Marland Kitchen esomeprazole (NEXIUM) 40 MG  capsule TAKE 1 CAPSULE BY MOUTH DAILY AT 12 NOON  . rosuvastatin (CRESTOR) 5 MG tablet TAKE 1 TABLET BY MOUTH DAILY  . tamsulosin (FLOMAX) 0.4 MG CAPS capsule Take 1 capsule (0.4 mg total) by mouth daily.  . valsartan-hydrochlorothiazide (DIOVAN-HCT) 160-12.5 MG tablet Take 1 tablet by mouth daily.  . [DISCONTINUED] dextromethorphan-guaiFENesin (MUCINEX DM) 30-600 MG 12hr tablet Take 1 tablet by mouth 2 (two) times daily.  . [DISCONTINUED] traMADol (ULTRAM) 50 MG tablet Take by mouth every 12 (twelve) hours as needed.   No facility-administered encounter medications on file as of 06/02/2019.    Activities of Daily Living In your present state of health, do you have any difficulty performing the following activities: 06/02/2019 10/07/2018  Hearing? N N  Comment declines hearing aids -  Vision? N N  Difficulty concentrating or making decisions? N Y  Walking or climbing stairs? N N  Dressing or bathing? N  N  Doing errands, shopping? N Y  Comment - Doesn't Physiological scientist and eating ? N -  Using the Toilet? N -  In the past six months, have you accidently leaked urine? N -  Do you have problems with loss of bowel control? N -  Managing your Medications? N -  Managing your Finances? N -  Housekeeping or managing your Housekeeping? N -  Some recent data might be hidden    Patient Care Team: Towanda Malkin, MD as PCP - General (Internal Medicine) Yolonda Kida, MD as Consulting Physician (Cardiology) [name], MD as Consulting Physician (Urology) Verdia Kuba, [facility] (Pharmacist)   Assessment:   This is a routine wellness examination for [facility].  Exercise Activities and Dietary recommendations Current Exercise Habits: Home exercise routine, Type of exercise: walking, Time (Minutes): 30, Frequency (Times/Week): 7, Weekly Exercise (Minutes/Week): 210, Intensity: Moderate, Exercise limited by: None identified  Goals    . DIET - INCREASE WATER INTAKE      Recommend to drink at least 6-8 8oz glasses of water per day.       Fall Risk Fall Risk  06/02/2019 01/25/2019 10/07/2018 07/05/2018 06/01/2018  Falls in the past year? 0 0 0 0 0  Number falls in past yr: 0 0 0 0 0  Injury with Fall? 0 0 0 0 0  Comment - - - - -  Risk for fall due to : No Fall Risks - - - -  Risk for fall due to: Comment - - - - -  Follow up Falls prevention discussed Falls evaluation completed - - Falls prevention discussed   FALL RISK PREVENTION PERTAINING TO THE HOME:  Any stairs in or around the home? No  If so, are there any without handrails? No   Home free of loose throw rugs in walkways, pet beds, electrical cords, etc? Yes  Adequate lighting in your home to reduce risk of falls? Yes   ASSISTIVE DEVICES UTILIZED TO PREVENT FALLS:  Life alert? No  Use of a cane, walker or w/c? No  Grab bars in the bathroom? Yes  Shower chair or bench in shower? Yes  Elevated toilet seat or a handicapped toilet? Yes   DME ORDERS:  DME order needed?  No   TIMED UP AND GO:  Was the test performed? No . Telephonic visit.   Education: Fall risk prevention has been discussed.  Intervention(s) required? No   DME/home health order needed?  No    Depression Screen PHQ 2/9 Scores 06/02/2019 01/25/2019 10/07/2018 07/05/2018  PHQ - 2 Score 0 0 0 0  PHQ- 9 Score - 0 0 0    Cognitive Function     6CIT Screen 06/02/2019 06/01/2018 05/28/2017  What Year? 0 points 0 points 0 points  What month? 0 points 0 points 3 points  What time? 0 points 0 points 3 points  Count back from 20 0 points 2 points 4 points  Months in reverse 4 points 4 points 4 points  Repeat phrase 10 points 10 points 10 points  Total Score 14 16 24     Immunization History  Administered Date(s) Administered  . Fluad Quad(high Dose 65+) 10/13/2018  . Influenza, High Dose Seasonal PF 10/20/2014, 09/13/2015, 09/10/2016, 10/27/2017  . PFIZER SARS-COV-2 Vaccination 02/21/2019, 03/22/2019  . Pneumococcal  Conjugate-13 03/30/2013  . Pneumococcal Polysaccharide-23 09/10/2016    Qualifies for Shingles Vaccine? Yes . Due for Shingrix. Education has been provided regarding the importance of this  vaccine. Pt has been advised to call insurance company to determine out of pocket expense. Advised may also receive vaccine at local pharmacy or Health Dept. Verbalized acceptance and understanding.  Tdap: Up to date  Flu Vaccine: Up to date  Pneumococcal Vaccine: Up to date  Covid-19 Vaccine: Up to date   Screening Tests Health Maintenance  Topic Date Due  . HEMOGLOBIN A1C  04/28/2018  . OPHTHALMOLOGY EXAM  06/19/2018  . INFLUENZA VACCINE  08/07/2019  . FOOT EXAM  03/30/2020  . TETANUS/TDAP  12/15/2021  . COVID-19 Vaccine  Completed  . PNA vac Low Risk Adult  Completed   Cancer Screenings:  Colorectal Screening: No longer required.   Lung Cancer Screening: (Low Dose CT Chest recommended if Age 31-80 years, 30 pack-year currently smoking OR have quit w/in 15years.) does not qualify.   Additional Screening:  Hepatitis C Screening: no longer required  Vision Screening: Recommended annual ophthalmology exams for early detection of glaucoma and other disorders of the eye. Is the patient up to date with their annual eye exam?  No  - telephonic visit.  Who is the provider or what is the name of the office in which the pt attends annual eye exams? [place] Screening: Recommended annual dental exams for proper oral hygiene  Community Resource Referral:  CRR required this visit?  No       Plan:    I have personally reviewed and addressed the Medicare Annual Wellness questionnaire and have noted the following in the patient's chart:  A. Medical and social history B. Use of alcohol, tobacco or illicit drugs  C. Current medications and supplements D. Functional ability and status E.  Nutritional status F.  Physical activity G. Advance directives H. List of other  physicians I.  Hospitalizations, surgeries, and ER visits in previous 12 months J.  [place] such as hearing and vision if needed, cognitive and depression L. Referrals and appointments   In addition, I have reviewed and discussed with patient certain preventive protocols, quality metrics, and best practice recommendations. A written personalized care plan for preventive services as well as general preventive health recommendations were provided to patient.   Signed,  Clemetine Marker, LPN Nurse Health Advisor   Nurse Notes: pt accompanied during visit today by his niece, Landis Gandy. He is overall doing well and appreciative of visit today.

## 2019-06-08 ENCOUNTER — Other Ambulatory Visit: Payer: Self-pay | Admitting: Internal Medicine

## 2019-06-08 DIAGNOSIS — K219 Gastro-esophageal reflux disease without esophagitis: Secondary | ICD-10-CM

## 2019-06-08 MED ORDER — ESOMEPRAZOLE MAGNESIUM 40 MG PO CPDR: DELAYED_RELEASE_CAPSULE | ORAL | 0 refills | Status: DC

## 2019-06-08 NOTE — Telephone Encounter (Signed)
Ok to change provider on Rx

## 2019-06-08 NOTE — Telephone Encounter (Signed)
RX REFILL esomeprazole ([place]) 40 MG capsule [id number]  [place], [place] Phone:  [phone]  Fax:  [phone]

## 2019-06-09 ENCOUNTER — Other Ambulatory Visit: Payer: Self-pay | Admitting: Internal Medicine

## 2019-06-09 DIAGNOSIS — I480 Paroxysmal atrial fibrillation: Secondary | ICD-10-CM

## 2019-06-15 ENCOUNTER — Ambulatory Visit: Payer: Medicare Other | Admitting: Pharmacist

## 2019-06-15 DIAGNOSIS — E782 Mixed hyperlipidemia: Secondary | ICD-10-CM

## 2019-06-15 DIAGNOSIS — E119 Type 2 diabetes mellitus without complications: Secondary | ICD-10-CM

## 2019-06-15 NOTE — Chronic Care Management (AMB) (Signed)
Chronic Care Management Pharmacy  Name: Charles Valencia  MRN: [id number] DOB: 08/01/1931  Chief Complaint/ HPI  Charles Valencia,  86 y.o. , male presents for their Initial CCM visit with the clinical pharmacist via telephone due to COVID-19 Pandemic.  PCP : Towanda Malkin, MD  Their chronic conditions include: SSS, HLD, DM, HTN  Office Visits: 1/19 HTN, [place], BP 138/78 P 68 Wt 214 BMI 29.1, DM diet controlled  Consult Visit: 4/12 Afib, [place], BP 124/76 P 73 Wt 218 BMI 29.6, EF 50%, falls risk, pacemaker not indicated   Medications: Outpatient Encounter Medications as of 06/15/2019  Medication Sig  . aspirin 81 MG tablet Take 81 mg by mouth daily.  Marland Kitchen ELIQUIS 2.5 MG TABS tablet TAKE 1 TABLET BY MOUTH TWICE A DAY  . esomeprazole (NEXIUM) 40 MG capsule TAKE 1 CAPSULE BY MOUTH DAILY AT 12 NOON  . rosuvastatin (CRESTOR) 5 MG tablet TAKE 1 TABLET BY MOUTH DAILY  . valsartan-hydrochlorothiazide (DIOVAN-HCT) 160-12.5 MG tablet Take 1 tablet by mouth daily.  . tamsulosin (FLOMAX) 0.4 MG CAPS capsule Take 1 capsule (0.4 mg total) by mouth daily. (Patient not taking: Reported on 06/15/2019)   No facility-administered encounter medications on file as of 06/15/2019.      Financial Resource Strain: Low Risk   . Difficulty of Paying Living Expenses: Not hard at all   Current Diagnosis/Assessment:  Goals Addressed            This Visit's Progress   . Chronic Care Management       CARE PLAN ENTRY  Current Barriers:  . Chronic Disease Management support, education, and care coordination needs related to Hypertension, Hyperlipidemia, and Diabetes   Hypertension . Pharmacist Clinical Goal(s): o Over the next 90 days, patient will work with PharmD and providers to maintain BP goal <140/90 . Current regimen:  o Valsartan-hydrochlorothiazide 160/12.5mg  daily . Interventions: o None . Patient self care activities - Over the next 90 days, patient will: o Check BP weekly, document,  and provide at future appointments o Ensure daily salt intake < 2300 mg/day  Hyperlipidemia . Pharmacist Clinical Goal(s): o Over the next 90 days, patient will work with PharmD and providers to maintain LDL goal < 70 . Current regimen:  o Crestor 5 mg daily . Interventions: o None . Patient self care activities - Over the next 90 days, patient will: o Continue current medication adherence strategy o Continue excellent lifestyle choices  Diabetes . Pharmacist Clinical Goal(s): o Over the next 90 days, patient will work with PharmD and providers to maintain A1c goal <7% . Current regimen:  o Lifestyle modifications, but A1c slowly rising . Interventions: o Metformin XR 500mg  with breakfast daily . Patient self care activities - Over the next 90 days, patient will: o Check blood sugar once daily, document, and provide at future appointments o Contact provider with any episodes of hypoglycemia o Take metformin if doctor decides to start it  Medication management . Pharmacist Clinical Goal(s): o Over the next 90 days, patient will work with PharmD and providers to maintain optimal medication adherence . Current pharmacy: [facility] . Interventions o Comprehensive medication review performed. o Continue current medication management strategy . Patient self care activities - Over the next 90 days, patient will: o Focus on medication adherence by maintaining current practices o Take medications as prescribed o Report any questions or concerns to PharmD and/or provider(s)  Initial goal documentation       Hypertension  Office blood pressures are  BP Readings from Last 3 Encounters:  01/25/19 138/78  12/22/18 112/71  10/13/18 136/72   Kidney Function Lab Results  Component Value Date/Time   CREATININE 0.96 10/27/2017 02:09 PM   CREATININE 1.07 04/27/2017 02:10 PM   GFRNONAA 72 10/27/2017 02:09 PM   GFRAA 84 10/27/2017 02:09 PM    Patient has failed  these meds in the past: NA  Patient checks BP at home infrequently  Patient home BP readings are ranging: NA  We discussed: He takes Diovan-HCT daily  Plan  Continue current medications   Hyperlipidemia   Lipid Panel     Component Value Date/Time   CHOL 109 10/27/2017 1409   TRIG 103 10/27/2017 1409   HDL 46 10/27/2017 1409   LDLCALC 44 10/27/2017 1409     The ASCVD Risk score (Goff DC Jr., et al., 2013) failed to calculate for the following reasons:   The 2013 ASCVD risk score is only valid for ages 41 to 29   Patient has failed these meds in past: NA Patient is currently controlled on the following medications:  . Crestor 5mg  daily  We discussed:   At goal  Plan  Continue current medications  Diabetes   Recent Relevant Labs: Lab Results  Component Value Date/Time   HGBA1C 6.9 (H) 10/27/2017 02:09 PM   HGBA1C 6.8 (H) 04/27/2017 02:10 PM   MICROALBUR 5.5 01/25/2019 12:04 PM   MICROALBUR 11.4 10/27/2017 02:09 PM    Checking BG: Never Patient is currently controlled on the following medications: lifestyle modifications  Last diabetic Foot exam:  Lab Results  Component Value Date/Time   HMDIABEYEEXA No Retinopathy 06/18/2017 12:00 AM    Last diabetic Eye exam: No results found for: HMDIABFOOTEX   We discussed:  A1c slowly creeping upward. May benefit from metformin soon. Patient agreed.  Plan  Start metformin XR 500mg  1 tab with breakfast daily Continue control with diet and exercise   Medication Management   Pt uses [facility] for all medications Uses pill box? Yes Pt endorses 100% compliance Not interested in [facility]  We discussed:  Has tried weaning off Nexium before. Failed. Didn't want to talk. Says everything is fine. Wanted to go back to watching TV.  Plan  Continue current medication management strategy  Follow up: 6 months  Milus Height, PharmD, [place], [place] [phone]

## 2019-06-15 NOTE — Patient Instructions (Addendum)
Visit Information  Goals Addressed            This Visit's Progress   . Chronic Care Management       CARE PLAN ENTRY  Current Barriers:  . Chronic Disease Management support, education, and care coordination needs related to Hypertension, Hyperlipidemia, and Diabetes   Hypertension . Pharmacist Clinical Goal(s): o Over the next 90 days, patient will work with PharmD and providers to maintain BP goal <140/90 . Current regimen:  o Valsartan-hydrochlorothiazide 160/12.5mg  daily . Interventions: o None . Patient self care activities - Over the next 90 days, patient will: o Check BP weekly, document, and provide at future appointments o Ensure daily salt intake < 2300 mg/day  Hyperlipidemia . Pharmacist Clinical Goal(s): o Over the next 90 days, patient will work with PharmD and providers to maintain LDL goal < 70 . Current regimen:  o Crestor 5 mg daily . Interventions: o None . Patient self care activities - Over the next 90 days, patient will: o Continue current medication adherence strategy o Continue excellent lifestyle choices  Diabetes . Pharmacist Clinical Goal(s): o Over the next 90 days, patient will work with PharmD and providers to maintain A1c goal <7% . Current regimen:  o Lifestyle modifications, but A1c slowly rising . Interventions: o Metformin XR 500mg  with breakfast daily . Patient self care activities - Over the next 90 days, patient will: o Check blood sugar once daily, document, and provide at future appointments o Contact provider with any episodes of hypoglycemia o Take metformin if doctor decides to start it  Medication management . Pharmacist Clinical Goal(s): o Over the next 90 days, patient will work with PharmD and providers to maintain optimal medication adherence . Current pharmacy: [facility] . Interventions o Comprehensive medication review performed. o Continue current medication management strategy . Patient self  care activities - Over the next 90 days, patient will: o Focus on medication adherence by maintaining current practices o Take medications as prescribed o Report any questions or concerns to PharmD and/or provider(s)  Initial goal documentation        Charles Valencia was given information about Chronic Care Management services today including:  1. CCM service includes personalized support from designated clinical staff supervised by his physician, including individualized plan of care and coordination with other care providers 2. 24/7 contact phone numbers for assistance for urgent and routine care needs. 3. Standard insurance, coinsurance, copays and deductibles apply for chronic care management only during months in which we provide at least 20 minutes of these services. Most insurances cover these services at 100%, however patients may be responsible for any copay, coinsurance and/or deductible if applicable. This service may help you avoid the need for more expensive face-to-face services. 4. Only one practitioner may furnish and bill the service in a calendar month. 5. The patient may stop CCM services at any time (effective at the end of the month) by phone call to the office staff.  Patient agreed to services and verbal consent obtained.   Print copy of patient instructions provided.  Telephone follow up appointment with pharmacy team member scheduled for: 6 months  Milus Height, PharmD, [place], [facility] [phone] Atrial Fibrillation  Atrial fibrillation is a type of heartbeat that is irregular or fast. If you have this condition, your heart beats without any order. This makes it hard for your heart to pump blood in a normal way. Atrial fibrillation may come and go, or  it may become a long-lasting problem. If this condition is not treated, it can put you at higher risk for stroke, heart failure, and other heart problems. What are the  causes? This condition may be caused by diseases that damage the heart. They include:  High blood pressure.  Heart failure.  Heart valve disease.  Heart surgery. Other causes include:  Diabetes.  Thyroid disease.  Being overweight.  Kidney disease. Sometimes the cause is not known. What increases the risk? You are more likely to develop this condition if:  You are older.  You smoke.  You exercise often and very hard.  You have a family history of this condition.  You are a man.  You use drugs.  You drink a lot of alcohol.  You have lung conditions, such as emphysema, pneumonia, or COPD.  You have sleep apnea. What are the signs or symptoms? Common symptoms of this condition include:  A feeling that your heart is beating very fast.  Chest pain or discomfort.  Feeling short of breath.  Suddenly feeling light-headed or weak.  Getting tired easily during activity.  Fainting.  Sweating. In some cases, there are no symptoms. How is this treated? Treatment for this condition depends on underlying conditions and how you feel when you have atrial fibrillation. They include:  Medicines to: ? Prevent blood clots. ? Treat heart rate or heart rhythm problems.  Using devices, such as a pacemaker, to correct heart rhythm problems.  Doing surgery to remove the part of the heart that sends bad signals.  Closing an area where clots can form in the heart (left atrial appendage). In some cases, your doctor will treat other underlying conditions. Follow these instructions at home: Medicines  Take over-the-counter and prescription medicines only as told by your doctor.  Do not take any new medicines without first talking to your doctor.  If you are taking blood thinners: ? Talk with your doctor before you take any medicines that have aspirin or NSAIDs, such as ibuprofen, in them. ? Take your medicine exactly as told by your doctor. Take it at the same time  each day. ? Avoid activities that could hurt or bruise you. Follow instructions about how to prevent falls. ? Wear a bracelet that says you are taking blood thinners. Or, carry a card that lists what medicines you take. Lifestyle      Do not use any products that have nicotine or tobacco in them. These include cigarettes, e-cigarettes, and chewing tobacco. If you need help quitting, ask your doctor.  Eat heart-healthy foods. Talk with your doctor about the right eating plan for you.  Exercise regularly as told by your doctor.  Do not drink alcohol.  Lose weight if you are overweight.  Do not use drugs, including cannabis. General instructions  If you have a condition that causes breathing to stop for a short period of time (apnea), treat it as told by your doctor.  Keep a healthy weight. Do not use diet pills unless your doctor says they are safe for you. Diet pills may make heart problems worse.  Keep all follow-up visits as told by your doctor. This is important. Contact a doctor if:  You notice a change in the speed, rhythm, or strength of your heartbeat.  You are taking a blood-thinning medicine and you get more bruising.  You get tired more easily when you move or exercise.  You have a sudden change in weight. Get help right away if:  You have pain in your chest or your belly (abdomen).  You have trouble breathing.  You have side effects of blood thinners, such as blood in your vomit, poop (stool), or pee (urine), or bleeding that cannot stop.  You have any signs of a stroke. "BE FAST" is an easy way to remember the main warning signs: ? B - Balance. Signs are dizziness, sudden trouble walking, or loss of balance. ? E - Eyes. Signs are trouble seeing or a change in how you see. ? F - Face. Signs are sudden weakness or loss of feeling in the face, or the face or eyelid drooping on one side. ? A - Arms. Signs are weakness or loss of feeling in an arm. This happens  suddenly and usually on one side of the body. ? S - Speech. Signs are sudden trouble speaking, slurred speech, or trouble understanding what people say. ? T - Time. Time to call emergency services. Write down what time symptoms started.  You have other signs of a stroke, such as: ? A sudden, very bad headache with no known cause. ? Feeling like you may vomit (nausea). ? Vomiting. ? A seizure. These symptoms may be an emergency. Do not wait to see if the symptoms will go away. Get medical help right away. Call your local emergency services (911 in the [place] Do not drive yourself to the hospital. Summary  Atrial fibrillation is a type of heartbeat that is irregular or fast.  You are at higher risk of this condition if you smoke, are older, have diabetes, or are overweight.  Follow your doctor's instructions about medicines, diet, exercise, and follow-up visits.  Get help right away if you have signs or symptoms of a stroke.  Get help right away if you cannot catch your breath, or you have chest pain or discomfort. This information is not intended to replace advice given to you by your health care provider. Make sure you discuss any questions you have with your health care provider. Document Revised: 06/16/2018 Document Reviewed: 06/16/2018 Elsevier Patient Education  [place].

## 2019-06-20 ENCOUNTER — Other Ambulatory Visit: Payer: Self-pay

## 2019-06-20 DIAGNOSIS — C61 Malignant neoplasm of prostate: Secondary | ICD-10-CM

## 2019-06-21 ENCOUNTER — Other Ambulatory Visit: Payer: Self-pay

## 2019-06-21 ENCOUNTER — Other Ambulatory Visit: Payer: Medicare Other

## 2019-06-21 DIAGNOSIS — C61 Malignant neoplasm of prostate: Secondary | ICD-10-CM

## 2019-06-22 ENCOUNTER — Encounter: Payer: Self-pay | Admitting: Urology

## 2019-06-22 ENCOUNTER — Ambulatory Visit (INDEPENDENT_AMBULATORY_CARE_PROVIDER_SITE_OTHER): Payer: Medicare Other | Admitting: Urology

## 2019-06-22 VITALS — BP 167/74 | HR 96 | Ht 72.0 in | Wt 223.0 lb

## 2019-06-22 DIAGNOSIS — C61 Malignant neoplasm of prostate: Secondary | ICD-10-CM | POA: Diagnosis not present

## 2019-06-22 LAB — PSA: Prostate Specific Ag, Serum: <0.1 ng/mL (ref 0.0–4.0)

## 2019-06-22 NOTE — Progress Notes (Addendum)
06/22/19 2:52 PM   Charles Valencia [birth year] [id number]  Referring provider: [name], [name],  [name] Chief Complaint  Patient presents with   Prostate Cancer   HPI: Charles Valencia is a 86 y.o. M with high risk clinically symptomatic prostate cancer on ADT only.  He returns today for 27-month follow-up with PSA prior.  He initially presented in 10/2016 with elevated PSA to 16.9.PSA was checked by his PCP on 09/10/2016 and markedly elevated to 16.9. Last known PSA in 2012 8.4.Rectal exam did show an enlarged gland which was massively enlarged with induration on the right lateral ridge without a discrete nodule although the exam was somewhat limited to habitus.PSA was repeated on 01/12/2017 aroseto 35.7.  Patient was extremely hesitant to undergo prostate biopsy due to concern for the enema thus this was delayed until 04/15/2017. Ultimately prostate biopsy on this day revealed high risk prostate cancer,Gleason 4+5 up to 100% of the tissue including allbiopsiesright side of the biopsy in 2 of the left.TRUS vol 53.  He underwent staging with CT scan and bone scan4/2019both of which are negative for any evidence of metastatic disease. Notably, there is borderline prominence of the seminal vesicles but otherwise no lymphadenopathy.  PSA remains undetectable.  He has been tolerating ADT without any issues.  No real issues with hot flashes. Voiding well.    PMH: Past Medical History:  Diagnosis Date   Diabetes ([place])    GERD (gastroesophageal reflux disease)    Glaucoma    Hyperlipidemia    Hypertension    Paroxysmal atrial fibrillation ([place])    Followed by Cardiology.    Surgical History: No past surgical history on file.  Home Medications:  Allergies as of 06/22/2019   No Known Allergies     Medication List       Accurate as of June 22, 2019 11:59 PM. If you have any questions, ask your nurse or doctor.         aspirin 81 MG tablet Take 81 mg by mouth daily.   Eliquis 2.5 MG Tabs tablet Generic drug: apixaban TAKE 1 TABLET BY MOUTH TWICE A DAY   esomeprazole 40 MG capsule Commonly known as: NEXIUM TAKE 1 CAPSULE BY MOUTH DAILY AT 12 NOON   rosuvastatin 5 MG tablet Commonly known as: CRESTOR TAKE 1 TABLET BY MOUTH DAILY   tamsulosin 0.4 MG Caps capsule Commonly known as: Flomax Take 1 capsule (0.4 mg total) by mouth daily.   valsartan-hydrochlorothiazide 160-12.5 MG tablet Commonly known as: DIOVAN-HCT Take 1 tablet by mouth daily.       Allergies: No Known Allergies  Family History: Family History  Problem Relation Age of Onset   Diabetes Sister    Hyperlipidemia Sister    Hypertension Sister    Kidney disease Sister    Alzheimer's disease Sister    Bladder Cancer Neg Hx    Kidney cancer Neg Hx    AAA (abdominal aortic aneurysm) Neg Hx     Social History:  reports that he has quit smoking. His smoking use included cigarettes. He has never used smokeless tobacco. He reports that he does not drink alcohol and does not use drugs.   Physical Exam: BP (!) 167/74    Pulse 96    Ht 6' (1.829 m)    Wt 223 lb (101.2 kg)    BMI 30.24 kg/m   Constitutional:  Alert and oriented, No acute distress. HEENT: [place] AT, moist mucus membranes.  Trachea midline, no masses. Cardiovascular: No clubbing, cyanosis, or edema. Respiratory: Normal respiratory effort, no increased work of breathing. Skin: No rashes, bruises or suspicious lesions. Neurologic: Grossly intact, no focal deficits, moving all 4 extremities. Psychiatric: Normal mood and affect.  Assessment & Plan:    1. Prostate cancer  High risk locally advanced prostate cancer on ADT only for symptom control given age and comorrbidities  Tolerating ADT without difficulty, 64-month leuprolide given today in the form of Eligard  Reviewed bone health and weightbearing exercise recommendations.  Plan to continue ADT  indefinitely, although may consider intermittent ADT down the road   2. BPH Continue flomax, urinary symptoms stable  Return in 6 months for PSA/ADT   [place] [address] [place], [place] [address] [phone]  I, Lucas Mallow, am acting as a scribe for Dr. [name],  I have reviewed the above documentation for accuracy and completeness, and I agree with the above.   [name], MD

## 2019-06-22 NOTE — Patient Instructions (Signed)
Continue on Vitamin D 800-1000iu and Calium 1000-1200mg  daily while on Androgen Deprivation Therapy.

## 2019-06-22 NOTE — Progress Notes (Signed)
Eligard SubQ Injection   Due to Prostate Cancer patient is present today for a Eligard Injection.  Medication: Eligard 6 month Dose: 45 mg  Location: right  PNT:[id number] Exp: 02/04/2021  Patient tolerated well, no complications were noted  Performed by: Verlene Mayer, [place]  Per Dr. Erlene Quan patient is to continue therapy for 6 months . Patient's next follow up was scheduled for 12/2019. This appointment was scheduled using wheel and given to patient today along with reminder continue on Vitamin D 800-1000iu and Calium 1000-1200mg  daily while on Androgen Deprivation Therapy.

## 2019-06-23 ENCOUNTER — Ambulatory Visit: Payer: Medicare Other | Admitting: Urology

## 2019-07-04 ENCOUNTER — Ambulatory Visit: Payer: Medicare Other | Admitting: Podiatry

## 2019-07-07 ENCOUNTER — Other Ambulatory Visit: Payer: Self-pay

## 2019-07-07 ENCOUNTER — Ambulatory Visit (INDEPENDENT_AMBULATORY_CARE_PROVIDER_SITE_OTHER): Payer: Medicare Other | Admitting: Podiatry

## 2019-07-07 ENCOUNTER — Encounter: Payer: Self-pay | Admitting: Podiatry

## 2019-07-07 DIAGNOSIS — M79674 Pain in right toe(s): Secondary | ICD-10-CM

## 2019-07-07 DIAGNOSIS — E119 Type 2 diabetes mellitus without complications: Secondary | ICD-10-CM | POA: Diagnosis not present

## 2019-07-07 DIAGNOSIS — M79675 Pain in left toe(s): Secondary | ICD-10-CM | POA: Diagnosis not present

## 2019-07-07 DIAGNOSIS — B351 Tinea unguium: Secondary | ICD-10-CM | POA: Diagnosis not present

## 2019-07-07 DIAGNOSIS — D689 Coagulation defect, unspecified: Secondary | ICD-10-CM | POA: Diagnosis not present

## 2019-07-07 NOTE — Progress Notes (Signed)
This patient returns to my office for at risk foot care.  This patient requires this care by a professional since this patient will be at risk due to having diabetes,chronic kidney disease and coagulation defect.  Patient is taking eliquiss.  This patient is unable to cut nails himself since the patient cannot reach his nails.These nails are painful walking and wearing shoes.  This patient presents for at risk foot care today.  General Appearance  Alert, conversant and in no acute stress.  Vascular  Dorsalis pedis and posterior tibial  pulses are palpable  bilaterally.  Capillary return is within normal limits  bilaterally. Temperature is within normal limits  bilaterally.  Neurologic  Senn-Weinstein monofilament wire test within normal limits  bilaterally. Muscle power within normal limits bilaterally.  Nails Thick disfigured discolored nails with subungual debris  from hallux to fifth toes bilaterally. No evidence of bacterial infection or drainage bilaterally.  Orthopedic  No limitations of motion  feet .  No crepitus or effusions noted.  No bony pathology or digital deformities noted.  Skin  normotropic skin with no porokeratosis noted bilaterally.  No signs of infections or ulcers noted.     Onychomycosis  Pain in right toes  Pain in left toes  Consent was obtained for treatment procedures.   Mechanical debridement of nails 1-5  bilaterally performed with a nail nipper.  Filed with dremel without incident.    Return office visit     4 months                Told patient to return for periodic foot care and evaluation due to potential at risk complications.   Gardiner Barefoot DPM

## 2019-07-25 ENCOUNTER — Ambulatory Visit: Payer: Medicare Other | Admitting: Family Medicine

## 2019-07-26 ENCOUNTER — Ambulatory Visit (INDEPENDENT_AMBULATORY_CARE_PROVIDER_SITE_OTHER): Payer: Medicare Other | Admitting: Internal Medicine

## 2019-07-26 ENCOUNTER — Encounter: Payer: Self-pay | Admitting: Internal Medicine

## 2019-07-26 ENCOUNTER — Other Ambulatory Visit: Payer: Self-pay

## 2019-07-26 VITALS — BP 140/82 | HR 76 | Temp 97.6°F | Resp 16 | Ht 72.0 in | Wt 220.2 lb

## 2019-07-26 DIAGNOSIS — E1129 Type 2 diabetes mellitus with other diabetic kidney complication: Secondary | ICD-10-CM

## 2019-07-26 DIAGNOSIS — I10 Essential (primary) hypertension: Secondary | ICD-10-CM

## 2019-07-26 DIAGNOSIS — I480 Paroxysmal atrial fibrillation: Secondary | ICD-10-CM

## 2019-07-26 DIAGNOSIS — R809 Proteinuria, unspecified: Secondary | ICD-10-CM

## 2019-07-26 DIAGNOSIS — E782 Mixed hyperlipidemia: Secondary | ICD-10-CM | POA: Diagnosis not present

## 2019-07-26 DIAGNOSIS — K219 Gastro-esophageal reflux disease without esophagitis: Secondary | ICD-10-CM

## 2019-07-26 DIAGNOSIS — C61 Malignant neoplasm of prostate: Secondary | ICD-10-CM

## 2019-07-26 DIAGNOSIS — I495 Sick sinus syndrome: Secondary | ICD-10-CM

## 2019-07-26 NOTE — Progress Notes (Signed)
Patient ID: Charles Valencia, male    DOB: April 03, 1931, 86 y.o.   MRN: [id number]  PCP: Charles Malkin, MD  Chief Complaint  Patient presents with  . Follow-up    6 month f/u    Subjective:   Charles Valencia is a 86 y.o. male, presents to clinic with CC of the following:  Chief Complaint  Patient presents with  . Follow-up    6 month f/u    HPI:  Patient is an 86 year old male patient of Charles Valencia Last seen in January 2021 Follows up today.  He followed up with cardiology on 04/18/2019 with that note reviewed.  Their assessment/plan was as follows:  The patient has been maintained on anticoagulation with Eliquis and has been without any falls since October of 2020. Most recent Cardiac diagnostic studies were performed in 2016 in which the Myoview was unremarkable and the echocardiogram was significant for normal LV function with an estimated EF of 50%, mild tricuspid and mitral valve insufficiency, mild RV enlargement, mild biatrial enlargement, mildly dilated aortic root and ascending aorta with mild LVH.  Plan  1. Chronic atrial fibrillation, rate controlled, on anticoagulation with Eliquis, recently stable, patient not on beta-blocker or rate control medication due to underlying bradycardia -We will continue anticoagulation with Eliquis. -We will obtain CBC to evaluate for any anemia.  2. Hypertension, reasonably controlled, patient is normotensive on today -We will continue management with valsartan-hydrochlorothiazide. -Recommend DASH diet.  3. Sick sinus syndrome, reasonably controlled, patient's heart rate on today is 73 bpm, pacemaker not indicated at this time -We will continue conservative management at this time.  4. Dyspnea, chronic, reasonably stable -We will schedule echocardiogram for current assessment of LV functioning and EF. -We will schedule Lexiscan/Myoview for current investigation of possible stress-induced myocardial ischemia.  5.  Hyperlipidemia, reasonably controlled, no recent lipid profile on file in [facility] including care everywhere. -We will continue management with Crestor 5 mg. -We will obtain blood laboratory work for lipid profile.  6. Routine adult health maintenance -We will obtain CBC, CMP and lipid profile as the patient has not had any blood laboratory work performed since 2019. -We will obtain cardiac functional studies as the patient has not had any since 2016 and it is needed for ongoing evaluation of cardiac functioning.  7. Fall, resolved, patient denies any falls since October 2020 -Educated the patient on the importance of fall precautions and he verbalized understanding of all information.   I could not find any of the lab results that were done as recommended on the cardiology visit  Hypertension: Medication regimen-valsartan-HCTZ 160-12.5mg  daily.  Takes medications regularly  Not check Bp at home BP Readings from Last 3 Encounters:  07/26/19 140/82  06/22/19 (!) 167/74  01/25/19 138/78   denies chest pain, SOB, palp's, increased headaches; no BLE edema.No vision changes.   Hyperlipidemia: Medication regimen-rosuvastatin 5mg  nightly; Lab Results  Component Value Date   CHOL 109 10/27/2017   HDL 46 10/27/2017   LDLCALC 44 10/27/2017   TRIG 103 10/27/2017   CHOLHDL 2.4 10/27/2017   No myalgias;   GERD:Patient states is well controlled on nexium. No abdominal pain, difficulty swallowing, black stools.    Prostate Cancer:Seeing Charles Valencia with Urology for management, last visit was 06/22/2019 for his Eligard injection.  He has high risk locally advanced prostate cancer on ADT only for symptom control given age and comorbidities.  Tolerating ADT without difficulty, with Eligard prescribed at 22-month intervals.  Not taking  flomax as brought meds with him and not in bag.   Lab Results  Component Value Date   PSA1 <0.1 06/21/2019   PSA1 <0.1 12/17/2018   PSA1 <0.1  06/18/2018   PSA 16.9 (H) 09/10/2016     Diabetes:Chronic care management team recommended starting Metformin XR 500 mg with breakfast daily on 06/15/2019.  Not yet started as I awaited f/u today.  Patient had not been on antidiabetic medications prior.   On statin and ARB  Lab Results  Component Value Date   HGBA1C 6.9 (H) 10/27/2017   HGBA1C 6.8 (H) 04/27/2017   HGBA1C 6.9 07/02/2016   Lab Results  Component Value Date   MICROALBUR 5.5 01/25/2019   LDLCALC 44 10/27/2017   CREATININE 0.96 10/27/2017     denies polydipsiaorpolyphagia.Has some polyuria due to enlarged prostate, often nocturia. Up once to twice overnight to urinate (was unclear if was taking Flomax previously, but was not taking at the time of this visit per the meds he brought with him)  Onychomycosis-sees podiatry, with last visit 07/07/2019.   Patient Active Problem List   Diagnosis Date Noted  . Coagulation disorder ([place]) 06/17/2018  . Type 2 diabetes mellitus without complication, without long-term current use of insulin ([place]) 04/27/2017  . Prostate cancer ([place]) 09/12/2016  . GERD (gastroesophageal reflux disease) 09/13/2015  . Hyperlipidemia 09/13/2015  . Atrial fibrillation ([place]) 10/17/2014  . Bradycardia 06/14/2014  . BP (high blood pressure) 06/14/2014  . Sick sinus syndrome ([place]) 06/14/2014      Current Outpatient Medications:  .  aspirin 325 MG tablet, Take 81 mg by mouth daily. , Disp: , Rfl:  .  ELIQUIS 2.5 MG TABS tablet, TAKE 1 TABLET BY MOUTH TWICE A DAY, Disp: 180 tablet, Rfl: 1 .  esomeprazole (NEXIUM) 40 MG capsule, TAKE 1 CAPSULE BY MOUTH DAILY AT 12 NOON, Disp: 90 capsule, Rfl: 0 .  rosuvastatin (CRESTOR) 5 MG tablet, TAKE 1 TABLET BY MOUTH DAILY, Disp: 90 tablet, Rfl: 1 .  tamsulosin (FLOMAX) 0.4 MG CAPS capsule, Take 1 capsule (0.4 mg total) by mouth daily., Disp: 30 capsule, Rfl: 11 .  valsartan-hydrochlorothiazide (DIOVAN-HCT) 160-12.5 MG tablet, Take 1 tablet by mouth daily.,  Disp: 90 tablet, Rfl: 1   No Known Allergies   No past surgical history on file.   Family History  Problem Relation Age of Onset  . Diabetes Sister   . Hyperlipidemia Sister   . Hypertension Sister   . Kidney disease Sister   . Alzheimer's disease Sister   . Bladder Cancer Neg Hx   . Kidney cancer Neg Hx   . AAA (abdominal aortic aneurysm) Neg Hx      Social History   Tobacco Use  . Smoking status: Former Smoker    Types: Cigarettes  . Smokeless tobacco: Never Used  . Tobacco comment: smoking cessation materials not required; poor historian, unable to recall smoking history  Substance Use Topics  . Alcohol use: No    Alcohol/week: 0.0 standard drinks    With staff assistance, above reviewed with the patient today.  ROS: As per HPI, otherwise no specific complaints on a limited and focused system review   No results found for this or any previous visit (from the past 72 hour(s)).   PHQ2/9: Depression screen [facility] 2/9 07/26/2019 06/02/2019 01/25/2019 10/07/2018 07/05/2018  Decreased Interest 0 0 0 0 0  Down, Depressed, Hopeless 0 0 0 0 0  PHQ - 2 Score 0 0 0 0 0  Altered sleeping  0 - 0 0 0  Tired, decreased energy 0 - 0 0 0  Change in appetite 0 - 0 0 0  Feeling bad or failure about yourself  0 - 0 0 0  Trouble concentrating 0 - 0 0 0  Moving slowly or fidgety/restless 0 - 0 0 0  Suicidal thoughts 0 - 0 0 0  PHQ-9 Score 0 - 0 0 0  Difficult doing work/chores Not difficult at all - Not difficult at all Not difficult at all Not difficult at all  Some recent data might be hidden   PHQ-2/9 Result is   Fall Risk: Fall Risk  07/26/2019 06/02/2019 01/25/2019 10/07/2018 07/05/2018  Falls in the past year? 0 0 0 0 0  Number falls in past yr: 0 0 0 0 0  Injury with Fall? 0 0 0 0 0  Comment - - - - -  Risk for fall due to : - No Fall Risks - - -  Risk for fall due to: Comment - - - - -  Follow up - Falls prevention discussed Falls evaluation completed - -      Objective:     Vitals:   07/26/19 1312  BP: 140/82  Pulse: 76  Resp: 16  Temp: 97.6 F (36.4 C)  TempSrc: Temporal  SpO2: 98%  Weight: 220 lb 3.2 oz (99.9 kg)  Height: 6' (1.829 m)    Body mass index is 29.86 kg/m.  Physical Exam   NAD, masked, pleasant HEENT - [place]/AT, sclera anicteric, PERRL, EOMI, conj - non-inj'ed,  pharynx clear Neck - supple, no adenopathy, no TM, carotids 2+ and = without bruits bilat Car - RRR without m/g/r Pulm- RR and effort normal at rest, CTA without wheeze or rales Abd - soft, NT diffusely, mildly obese, ND,  Back - no CVA tenderness Ext - no LE edema, no active joints Neuro/psychiatric - affect was not flat, appropriate with conversation  Alert   Grossly non-focal - good strength on testing extremities, sensation intact to LT in distal extremities  Speech normal   Results for orders placed or performed in visit on 06/21/19  PSA  Result Value Ref Range   Prostate Specific Ag, Serum <0.1 0.0 - 4.0 ng/mL       Assessment & Plan:   1. Essential hypertension Blood pressure remains controlled on his medication presently. Continue his current medication presently. We will recheck labs today - COMPLETE METABOLIC PANEL WITH GFR  2. Mixed hyperlipidemia Last lipid panel was in 2019. On a low-dose Crestor product.,  Tolerating the medicine. We will recheck the lipid panel today. - Lipid panel - COMPLETE METABOLIC PANEL WITH GFR  3. Type 2 diabetes mellitus with microalbuminuria, without long-term current use of insulin (HCC) We will check an A1c today, And not feel extremely tight control of sugars as needed presently, and must weigh the risk/benefit of adding a product like Metformin to his regimen presently Await lab results - COMPLETE METABOLIC PANEL WITH GFR - Hemoglobin A1c  4. Gastroesophageal reflux disease, unspecified whether esophagitis present He notes his reflux has been well controlled recently.  - CBC with Differential/Platelet  5.  Prostate cancer ([place]) Continues to follow with urology, Last notes reviewed. - COMPLETE METABOLIC PANEL WITH GFR - CBC with Differential/Platelet  6. Paroxysmal atrial fibrillation (HCC) 7. Sick sinus syndrome (HCC)  Continues to follow with cardiology   Await labs ordered today, and tentatively scheduled to follow-up in 6 months time, sooner as needed. I did  ask that he inquire from urology whether he should be taking Flomax at this point, as I do think it would help with his symptoms.  It was on his bed this, although he did not bring the bottle with him and he is not taking that presently. Follow-up sooner as needed.      Charles Malkin, MD 07/26/19 1:30 PM

## 2019-07-26 NOTE — Patient Instructions (Signed)
Please ask urology about Flomax medication, and should you be taking?

## 2019-07-27 LAB — CBC WITH DIFFERENTIAL/PLATELET
Absolute Monocytes: 442 cells/uL (ref 200–950)
Basophils Absolute: 31 cells/uL (ref 0–200)
Basophils Relative: 0.6 %
Eosinophils Absolute: 239 cells/uL (ref 15–500)
Eosinophils Relative: 4.6 %
HCT: 42.5 % (ref 38.5–50.0)
Hemoglobin: 14.3 g/dL (ref 13.2–17.1)
Lymphs Abs: 957 cells/uL (ref 850–3900)
MCH: 30.1 pg (ref 27.0–33.0)
MCHC: 33.6 g/dL (ref 32.0–36.0)
MCV: 89.5 fL (ref 80.0–100.0)
MPV: 10.8 fL (ref 7.5–12.5)
Monocytes Relative: 8.5 %
Neutro Abs: 3531 cells/uL (ref 1500–7800)
Neutrophils Relative %: 67.9 %
Platelets: 211 10*3/uL (ref 140–400)
RBC: 4.75 10*6/uL (ref 4.20–5.80)
RDW: 13.3 % (ref 11.0–15.0)
Total Lymphocyte: 18.4 %
WBC: 5.2 10*3/uL (ref 3.8–10.8)

## 2019-07-27 LAB — COMPLETE METABOLIC PANEL WITH GFR
AG Ratio: 1.4 (calc) (ref 1.0–2.5)
ALT: 16 U/L (ref 9–46)
AST: 23 U/L (ref 10–35)
Albumin: 4.3 g/dL (ref 3.6–5.1)
Alkaline phosphatase (APISO): 85 U/L (ref 35–144)
BUN/Creatinine Ratio: 14 (calc) (ref 6–22)
BUN: 21 mg/dL (ref 7–25)
CO2: 27 mmol/L (ref 20–32)
Calcium: 10.1 mg/dL (ref 8.6–10.3)
Chloride: 104 mmol/L (ref 98–110)
Creat: 1.48 mg/dL — ABNORMAL HIGH (ref 0.70–1.11)
GFR, Est African American: 49 mL/min/{1.73_m2} — ABNORMAL LOW (ref >=60)
GFR, Est Non African American: 42 mL/min/{1.73_m2} — ABNORMAL LOW (ref >=60)
Globulin: 3 g/dL (calc) (ref 1.9–3.7)
Glucose, Bld: 103 mg/dL — ABNORMAL HIGH (ref 65–99)
Potassium: 4 mmol/L (ref 3.5–5.3)
Sodium: 142 mmol/L (ref 135–146)
Total Bilirubin: 0.9 mg/dL (ref 0.2–1.2)
Total Protein: 7.3 g/dL (ref 6.1–8.1)

## 2019-07-27 LAB — LIPID PANEL
Cholesterol: 122 mg/dL (ref <200)
HDL: 46 mg/dL (ref >=40)
LDL Cholesterol (Calc): 57 mg/dL (calc)
Non-HDL Cholesterol (Calc): 76 mg/dL (calc) (ref <130)
Total CHOL/HDL Ratio: 2.7 (calc) (ref <5.0)
Triglycerides: 100 mg/dL (ref <150)

## 2019-07-27 LAB — HEMOGLOBIN A1C
Hgb A1c MFr Bld: 7.2 % of total Hgb — ABNORMAL HIGH (ref <5.7)
Mean Plasma Glucose: 160 (calc)
eAG (mmol/L): 8.9 (calc)

## 2019-08-01 ENCOUNTER — Ambulatory Visit: Payer: Medicare Other | Admitting: Podiatry

## 2019-08-23 NOTE — Progress Notes (Signed)
Patient ID: Charles Valencia, male    DOB: 13-Dec-1931, 86 y.o.   MRN: [id number]  PCP: Towanda Malkin, MD  Chief Complaint  Patient presents with  . Back Pain    started last thursday and got worse on Saturday, starts at center of lower back and goes around to back    Subjective:   Charles Valencia is a 86 y.o. male, presents to clinic with CC of the following:  Chief Complaint  Patient presents with  . Back Pain    started last thursday and got worse on Saturday, starts at center of lower back and goes around to back    HPI:  Patient is an 86 year old male I last saw in July for his 60-month follow-up that note reviewed. Communication after the lab results were obtained from that visit was as follows:  Please let the patient know that his labs that have returned from yesterday were reviewed. His lipid panel remains excellent. The complete metabolic panel showed that his kidney function has worsened some with a creatinine 1.48 and the GFR in the low 40s. The glucose was only slightly high at 103, and I am still awaiting the A1c result. The rest of the complete metabolic panel was normal with the liver function tests, electrolytes, and calcium level normal. The complete blood count was also entirely normal. I think it would be best to change his follow-up from 6 months time to 3 months time and recheck his kidney function at that time. Please change his follow-up appointment. Also please tell him to make sure he is staying well-hydrated in these warmer months and avoiding NSAIDs (products like ibuprofen which is Advil, Motrin, and Aleve products.).  Thanks, [facility] The A1c returned at 7.2, just very slightly above the last check with plans to follow-up again in 3 months time.   Follows up today with complaints of back pain. He is a challenging historian.  He does have a significant cardiac history, followed by cardiology, also with high risk locally advanced prostate  cancer and on ADT for symptom control given age and comorbidities, also with diabetes, CKD, hypertension, hyperlipidemia and GERD on a PPI   He notes that the pain started last Thursday around 6 PM in the evening, and that it started in the lower abdomen/suprapubic region on the left side.  He felt it at times go around the belly towards the back, to the left lower back area.  It continued to worsen into the weekend.  He denied any trauma before this occurred.  Felt it intermittently, and then more persistent into the weekend.  Bothered him at rest, and worsened with movements.  Denies any pains down the legs, although states his legs felt achy during the weekend.  Denies any weakness or foot drop.  Denied any bowel or bladder symptoms, with no dysuria, no difficulty urinating more than normal, no blood in the urine, and no diarrhea or constipation symptoms, with normal bowel movements and no blood in the stools and no black or dark stools. He denies any history of kidney stones. He denies any fevers, denied any infectious symptoms with no cough, no increased mucus, no nausea or vomiting. He tried Tylenol products-2 in the morning and 2 later in the day. He states the pain has improved some in the last 2 to 3 days, although still has symptoms. Denies any numbness or tingling in the lower extremities. He notes he has not had this type of pain before. He  denies a history of diverticulitis in his past, and also denies a history of prostatitis  He has a history of prostate cancer:  Prostate Cancer:Seeing Dr. Erlene Quan with Urology for management, last visit was 06/22/2019 for his Eligard injection.  He has high risk locally advanced prostate cancer on ADT only for symptom control given age and comorbidities.  Tolerating ADT without difficulty, with Eligard prescribed at 2-month intervals.  Not taking flomax       Lab Results  Component Value Date   PSA1 <0.1 06/21/2019   PSA1 <0.1 12/17/2018    PSA1 <0.1 06/18/2018   PSA 16.9 (H) 09/10/2016    He also has a history of CKD-stage III, with a slight worsening noted on his recent labs. Lab Results  Component Value Date   CREATININE 1.48 (H) 07/26/2019   BUN 21 07/26/2019   NA 142 07/26/2019   K 4.0 07/26/2019   CL 104 07/26/2019   CO2 27 07/26/2019       Patient Active Problem List   Diagnosis Date Noted  . Coagulation disorder ([place]) 06/17/2018  . Type 2 diabetes mellitus with microalbuminuria, without long-term current use of insulin ([place]) 04/27/2017  . Prostate cancer ([place]) 09/12/2016  . GERD (gastroesophageal reflux disease) 09/13/2015  . Hyperlipidemia 09/13/2015  . Atrial fibrillation ([place]) 10/17/2014  . Bradycardia 06/14/2014  . BP (high blood pressure) 06/14/2014  . Sick sinus syndrome ([place]) 06/14/2014      Current Outpatient Medications:  .  aspirin 325 MG tablet, Take 81 mg by mouth daily. , Disp: , Rfl:  .  ELIQUIS 2.5 MG TABS tablet, TAKE 1 TABLET BY MOUTH TWICE A DAY, Disp: 180 tablet, Rfl: 1 .  esomeprazole (NEXIUM) 40 MG capsule, TAKE 1 CAPSULE BY MOUTH DAILY AT 12 NOON, Disp: 90 capsule, Rfl: 0 .  rosuvastatin (CRESTOR) 5 MG tablet, TAKE 1 TABLET BY MOUTH DAILY, Disp: 90 tablet, Rfl: 1 .  tamsulosin (FLOMAX) 0.4 MG CAPS capsule, Take 1 capsule (0.4 mg total) by mouth daily., Disp: 30 capsule, Rfl: 11 .  valsartan-hydrochlorothiazide (DIOVAN-HCT) 160-12.5 MG tablet, Take 1 tablet by mouth daily., Disp: 90 tablet, Rfl: 1   No Known Allergies   No past surgical history on file.   Family History  Problem Relation Age of Onset  . Diabetes Sister   . Hyperlipidemia Sister   . Hypertension Sister   . Kidney disease Sister   . Alzheimer's disease Sister   . Bladder Cancer Neg Hx   . Kidney cancer Neg Hx   . AAA (abdominal aortic aneurysm) Neg Hx      Social History   Tobacco Use  . Smoking status: Former Smoker    Types: Cigarettes  . Smokeless tobacco: Never Used  . Tobacco comment:  smoking cessation materials not required; poor historian, unable to recall smoking history  Substance Use Topics  . Alcohol use: No    Alcohol/week: 0.0 standard drinks    With staff assistance, above reviewed with the patient today.  ROS: As per HPI, otherwise no specific complaints on a limited and focused system review   No results found for this or any previous visit (from the past 72 hour(s)).   PHQ2/9: Depression screen [facility] 2/9 08/24/2019 07/26/2019 06/02/2019 01/25/2019 10/07/2018  Decreased Interest 0 0 0 0 0  Down, Depressed, Hopeless 0 0 0 0 0  PHQ - 2 Score 0 0 0 0 0  Altered sleeping 0 0 - 0 0  Tired, decreased energy 1 0 - 0  0  Change in appetite 0 0 - 0 0  Feeling bad or failure about yourself  0 0 - 0 0  Trouble concentrating 0 0 - 0 0  Moving slowly or fidgety/restless 0 0 - 0 0  Suicidal thoughts 0 0 - 0 0  PHQ-9 Score 1 0 - 0 0  Difficult doing work/chores Not difficult at all Not difficult at all - Not difficult at all Not difficult at all  Some recent data might be hidden   PHQ-2/9 Result is neg  Fall Risk: Fall Risk  08/24/2019 07/26/2019 06/02/2019 01/25/2019 10/07/2018  Falls in the past year? 0 0 0 0 0  Number falls in past yr: 0 0 0 0 0  Injury with Fall? 0 0 0 0 0  Comment - - - - -  Risk for fall due to : - - No Fall Risks - -  Risk for fall due to: Comment - - - - -  Follow up - - Falls prevention discussed Falls evaluation completed -      Objective:   Vitals:   08/24/19 1308  BP: 134/88  Pulse: 82  Resp: 16  Temp: (!) 97.5 F (36.4 C)  TempSrc: Oral  SpO2: 100%  Weight: 215 lb 8 oz (97.8 kg)  Height: 6' (1.829 m)    Body mass index is 29.23 kg/m.  Physical Exam   NAD, masked, pleasant, not ill-appearing HEENT - [place]/AT, sclera anicteric, PERRL, EOMI, conj - non-inj'ed, pharynx clear Neck - supple, no adenopathy,  Car - RRR without m/g/r Pulm- RR and effort normal at rest, CTA without wheeze or rales Abd - soft, mildly protuberant,  mildly tender in the lower part of the left lower quadrant towards the waistline, and extending into the suprapubic region, nontender elsewhere in the abdomen, no rebound or guarding evident, ND, BS+,  no masses,  Back - no CVA tenderness, minimal tenderness in the left lower back area, not marked, and nontender over the lumbar sacral spine, nontender right of the spine and in the right lower back muscle region. Straight leg raise was negative, with him noting some mild discomfort with straight leg elevation on the left felt in that lower abdominal area.  (No radiating pain down the back of the leg), felt some mild discomfort bringing his left knee to his chest while supine in that left lower abdominal region. Ext - no LE edema, no radicular signs down the left lower extremity. GU/rectal-no swelling in the suprapubic region, no inguinal hernia concern, prostate was nontender to palpation in the upper poles, no fluctuance, no nodularities, mildly enlarged, Neuro/psychiatric - affect was not flat, appropriate with conversation  Alert   Grossly non-focal, DTRs were 1+ and equal in the patella and Achilles, sensation intact to light touch in the distal lower extremities, strength adequate in the lower extremities including with dorsi and plantar flexion of the feet.  Speech normal   Results for orders placed or performed in visit on 07/26/19  Lipid panel  Result Value Ref Range   Cholesterol 122 <200 mg/dL   HDL 46 > OR = 40 mg/dL   Triglycerides 100 <150 mg/dL   LDL Cholesterol (Calc) 57 mg/dL (calc)   Total CHOL/HDL Ratio 2.7 <5.0 (calc)   Non-HDL Cholesterol (Calc) 76 <130 mg/dL (calc)  COMPLETE METABOLIC PANEL WITH GFR  Result Value Ref Range   Glucose, Bld 103 (H) 65 - 99 mg/dL   BUN 21 7 - 25 mg/dL   Creat 1.48 (  H) 0.70 - 1.11 mg/dL   GFR, Est Non African American 42 (L) > OR = 60 mL/min/1.73m2   GFR, Est African American 49 (L) > OR = 60 mL/min/1.19m2   BUN/Creatinine Ratio 14 6 - 22  (calc)   Sodium 142 135 - 146 mmol/L   Potassium 4.0 3.5 - 5.3 mmol/L   Chloride 104 98 - 110 mmol/L   CO2 27 20 - 32 mmol/L   Calcium 10.1 8.6 - 10.3 mg/dL   Total Protein 7.3 6.1 - 8.1 g/dL   Albumin 4.3 3.6 - 5.1 g/dL   Globulin 3.0 1.9 - 3.7 g/dL (calc)   AG Ratio 1.4 1.0 - 2.5 (calc)   Total Bilirubin 0.9 0.2 - 1.2 mg/dL   Alkaline phosphatase (APISO) 85 35 - 144 U/L   AST 23 10 - 35 U/L   ALT 16 9 - 46 U/L  Hemoglobin A1c  Result Value Ref Range   Hgb A1c MFr Bld 7.2 (H) <5.7 % of total Hgb   Mean Plasma Glucose 160 (calc)   eAG (mmol/L) 8.9 (calc)  CBC with Differential/Platelet  Result Value Ref Range   WBC 5.2 3.8 - 10.8 Thousand/uL   RBC 4.75 4.20 - 5.80 Million/uL   Hemoglobin 14.3 13.2 - 17.1 g/dL   HCT 42.5 38 - 50 %   MCV 89.5 80.0 - 100.0 fL   MCH 30.1 27.0 - 33.0 pg   MCHC 33.6 32.0 - 36.0 g/dL   RDW 13.3 11.0 - 15.0 %   Platelets 211 140 - 400 Thousand/uL   MPV 10.8 7.5 - 12.5 fL   Neutro Abs 3,531 1,500 - 7,800 cells/uL   Lymphs Abs 957 850 - 3,900 cells/uL   Absolute Monocytes 442 200 - 950 cells/uL   Eosinophils Absolute 239 15 - 500 cells/uL   Basophils Absolute 31 0 - 200 cells/uL   Neutrophils Relative % 67.9 %   Total Lymphocyte 18.4 %   Monocytes Relative 8.5 %   Eosinophils Relative 4.6 %   Basophils Relative 0.6 %   Last labs reviewed Urine dip in the office was completely negative    Assessment & Plan:   1. Left lower quadrant abdominal pain Patient noted his symptoms started in the left lower quadrant/suprapubic region, and then started to radiate around the waist towards the lower back, not starting in the lower back.  Exact source unclear.  Urine dip in the office negative, prostate exam unremarkable, no concerning bowel or bladder symptoms noted recent past.  Kidney stone unlikely with no blood in the urine, and no history.  Doubt UTI or infectious source.  Cannot exclude a diverticulitis possibility, and has no history of this.  Could  be a muscle strain, no marked hernia evident on exam Felt best to check a urine dip which was negative, and also will check some lab tests as ordered We will have those back tomorrow and made him aware that. Can continue Tylenol products as needed in the short-term, and we will hold on any further pain medicines presently Stay bland with his diet, also stay well-hydrated.  Discussed the above with him, and tentatively will schedule follow-up early next week, follow-up sooner as needed, and if more severe symptoms as we discussed today, may need to follow-up more emergently in an ER setting. - CBC with Differential/Platelet - BASIC METABOLIC PANEL WITH GFR - POCT Urinalysis Dipstick  2. Acute left-sided low back pain without sciatica As above, do not feel it started in the  back, but rather radiated around to the back Continue to monitor with work-up as above - CBC with Differential/Platelet - BASIC METABOLIC PANEL WITH GFR  3. Prostate cancer (HCC) Has a history of this, sees urology, with last PSA as noted above being less than 0.1. Continuing to monitor  4. Stage 3a chronic kidney disease Noted this on his last lab tests with the slight worsening of the kidney function noted. Felt best to check another BMP today. - BASIC METABOLIC PANEL WITH GFR  5. Suprapubic pain As above, and really doubt a prostatitis concern given the assessment today. Management as above noted. - POCT Urinalysis Dipstick   Await labs, continue to closely monitor, and a tentative follow-up scheduled for early next week, follow-up sooner as needed.    Towanda Malkin, MD 08/24/19 1:12 PM

## 2019-08-24 ENCOUNTER — Ambulatory Visit (INDEPENDENT_AMBULATORY_CARE_PROVIDER_SITE_OTHER): Payer: Medicare Other | Admitting: Internal Medicine

## 2019-08-24 ENCOUNTER — Other Ambulatory Visit: Payer: Self-pay

## 2019-08-24 VITALS — BP 134/88 | HR 82 | Temp 97.5°F | Resp 16 | Ht 72.0 in | Wt 215.5 lb

## 2019-08-24 DIAGNOSIS — R1032 Left lower quadrant pain: Secondary | ICD-10-CM

## 2019-08-24 DIAGNOSIS — R102 Pelvic and perineal pain: Secondary | ICD-10-CM

## 2019-08-24 DIAGNOSIS — C61 Malignant neoplasm of prostate: Secondary | ICD-10-CM | POA: Diagnosis not present

## 2019-08-24 DIAGNOSIS — M545 Low back pain, unspecified: Secondary | ICD-10-CM

## 2019-08-24 DIAGNOSIS — N1831 Chronic kidney disease, stage 3a: Secondary | ICD-10-CM

## 2019-08-24 LAB — CBC WITH DIFFERENTIAL/PLATELET
Absolute Monocytes: 502 cells/uL (ref 200–950)
Basophils Absolute: 41 cells/uL (ref 0–200)
Basophils Relative: 0.7 %
Eosinophils Absolute: 254 cells/uL (ref 15–500)
Eosinophils Relative: 4.3 %
HCT: 45.1 % (ref 38.5–50.0)
Hemoglobin: 14.9 g/dL (ref 13.2–17.1)
Lymphs Abs: 1269 cells/uL (ref 850–3900)
MCH: 29.6 pg (ref 27.0–33.0)
MCHC: 33 g/dL (ref 32.0–36.0)
MCV: 89.5 fL (ref 80.0–100.0)
MPV: 10.8 fL (ref 7.5–12.5)
Monocytes Relative: 8.5 %
Neutro Abs: 3835 cells/uL (ref 1500–7800)
Neutrophils Relative %: 65 %
Platelets: 245 10*3/uL (ref 140–400)
RBC: 5.04 10*6/uL (ref 4.20–5.80)
RDW: 13.3 % (ref 11.0–15.0)
Total Lymphocyte: 21.5 %
WBC: 5.9 10*3/uL (ref 3.8–10.8)

## 2019-08-24 LAB — BASIC METABOLIC PANEL WITH GFR
BUN/Creatinine Ratio: 16 (calc) (ref 6–22)
BUN: 22 mg/dL (ref 7–25)
CO2: 26 mmol/L (ref 20–32)
Calcium: 9.8 mg/dL (ref 8.6–10.3)
Chloride: 104 mmol/L (ref 98–110)
Creat: 1.37 mg/dL — ABNORMAL HIGH (ref 0.70–1.11)
GFR, Est African American: 54 mL/min/{1.73_m2} — ABNORMAL LOW (ref >=60)
GFR, Est Non African American: 46 mL/min/{1.73_m2} — ABNORMAL LOW (ref >=60)
Glucose, Bld: 103 mg/dL — ABNORMAL HIGH (ref 65–99)
Potassium: 3.9 mmol/L (ref 3.5–5.3)
Sodium: 140 mmol/L (ref 135–146)

## 2019-08-24 LAB — POCT URINALYSIS DIPSTICK
Appearance: NORMAL
Bilirubin, UA: NEGATIVE
Blood, UA: NEGATIVE
Glucose, UA: NEGATIVE
Ketones, UA: NEGATIVE
Leukocytes, UA: NEGATIVE
Nitrite, UA: NEGATIVE
Odor: NORMAL
Protein, UA: NEGATIVE
Spec Grav, UA: 1.015 (ref 1.010–1.025)
Urobilinogen, UA: 0.2 E.U./dL
pH, UA: 6 (ref 5.0–8.0)

## 2019-08-24 NOTE — Patient Instructions (Signed)
Can continue to take Tylenol products-up to 2 tablets 3-4 times a day as needed  Continue to stay well-hydrated, also stay bland with your diet presently  Await results of labs ordered today  If the pain gets more severe, or if starts to have accompanying nausea and vomiting, fevers, bleeding per rectum, please get seen more emergently and in the emergency room setting.

## 2019-08-26 NOTE — Progress Notes (Signed)
Patient ID: Charles Valencia, male    DOB: 09-06-1931, 86 y.o.   MRN: [id number]  PCP: Towanda Malkin, MD  Chief Complaint  Patient presents with  . Follow-up    lower quadrant pain much improved, he is doing very well    Subjective:   Charles Valencia is a 86 y.o. male, presents to clinic with CC of the following:  Chief Complaint  Patient presents with  . Follow-up    lower quadrant pain much improved, he is doing very well    HPI:  Patient is an 86 year old male Last visit with me was 08/24/2019. Lab results after that visit were reviewed and no findings of concern Follows up today after that visit.  He was seen last visit for suprapubic/left lower quadrant abdominal pain that radiated around to the back.  It had improved some  2 or 3 days before our visit, with it worse the weekend before. The exact source was unclear, with the lab work-up unremarkable.  Recommended continuing Tylenol products as needed staying bland with his diet and staying well-hydrated, and following up today.  Since that last visit, he noted his symptoms have resolved.  At night after our visit, things were better, and the next day were much much improved to resolved.  He notes he has had no recent change in bowel habits of concern, no urinary symptoms of concern, and feels much better. He has lessened soda use, as he thought that may be a contributor to his symptoms.  I did agree with lessening soda use as a good idea.  He has no other complaints today.  Patient Active Problem List   Diagnosis Date Noted  . Stage 3a chronic kidney disease 08/24/2019  . Coagulation disorder ([place]) 06/17/2018  . Type 2 diabetes mellitus with microalbuminuria, without long-term current use of insulin ([place]) 04/27/2017  . Prostate cancer ([place]) 09/12/2016  . GERD (gastroesophageal reflux disease) 09/13/2015  . Hyperlipidemia 09/13/2015  . Atrial fibrillation ([place]) 10/17/2014  . Bradycardia 06/14/2014  .  BP (high blood pressure) 06/14/2014  . Sick sinus syndrome ([place]) 06/14/2014      Current Outpatient Medications:  .  aspirin 325 MG tablet, Take 81 mg by mouth daily. , Disp: , Rfl:  .  ELIQUIS 2.5 MG TABS tablet, TAKE 1 TABLET BY MOUTH TWICE A DAY, Disp: 180 tablet, Rfl: 1 .  esomeprazole (NEXIUM) 40 MG capsule, TAKE 1 CAPSULE BY MOUTH DAILY AT 12 NOON, Disp: 90 capsule, Rfl: 0 .  rosuvastatin (CRESTOR) 5 MG tablet, TAKE 1 TABLET BY MOUTH DAILY, Disp: 90 tablet, Rfl: 1 .  valsartan-hydrochlorothiazide (DIOVAN-HCT) 160-12.5 MG tablet, Take 1 tablet by mouth daily., Disp: 90 tablet, Rfl: 1 .  tamsulosin (FLOMAX) 0.4 MG CAPS capsule, Take 1 capsule (0.4 mg total) by mouth daily. (Patient not taking: Reported on 08/30/2019), Disp: 30 capsule, Rfl: 11   No Known Allergies   History reviewed. No pertinent surgical history.   Family History  Problem Relation Age of Onset  . Diabetes Sister   . Hyperlipidemia Sister   . Hypertension Sister   . Kidney disease Sister   . Alzheimer's disease Sister   . Bladder Cancer Neg Hx   . Kidney cancer Neg Hx   . AAA (abdominal aortic aneurysm) Neg Hx      Social History   Tobacco Use  . Smoking status: Former Smoker    Types: Cigarettes  . Smokeless tobacco: Never Used  . Tobacco  comment: smoking cessation materials not required; poor historian, unable to recall smoking history  Substance Use Topics  . Alcohol use: No    Alcohol/week: 0.0 standard drinks    With staff assistance, above reviewed with the patient today.  ROS: As per HPI, otherwise no specific complaints on a limited and focused system review   No results found for this or any previous visit (from the past 72 hour(s)).   PHQ2/9: Depression screen [facility] 2/9 08/30/2019 08/24/2019 07/26/2019 06/02/2019 01/25/2019  Decreased Interest 0 0 0 0 0  Down, Depressed, Hopeless 0 0 0 0 0  PHQ - 2 Score 0 0 0 0 0  Altered sleeping - 0 0 - 0  Tired, decreased energy - 1 0 - 0  Change  in appetite - 0 0 - 0  Feeling bad or failure about yourself  - 0 0 - 0  Trouble concentrating - 0 0 - 0  Moving slowly or fidgety/restless - 0 0 - 0  Suicidal thoughts - 0 0 - 0  PHQ-9 Score - 1 0 - 0  Difficult doing work/chores - Not difficult at all Not difficult at all - Not difficult at all  Some recent data might be hidden   PHQ-2/9 Result is neg  Fall Risk: Fall Risk  08/30/2019 08/24/2019 07/26/2019 06/02/2019 01/25/2019  Falls in the past year? 0 0 0 0 0  Number falls in past yr: 0 0 0 0 0  Injury with Fall? 0 0 0 0 0  Comment - - - - -  Risk for fall due to : - - - No Fall Risks -  Risk for fall due to: Comment - - - - -  Follow up - - - Falls prevention discussed Falls evaluation completed      Objective:   Vitals:   08/30/19 1041  BP: 136/88  Pulse: (!) 58  Resp: 16  Temp: 97.8 F (36.6 C)  TempSrc: Temporal  SpO2: 97%  Weight: 220 lb 6.4 oz (100 kg)  Height: 6' (1.829 m)    Body mass index is 29.89 kg/m.  Physical Exam   NAD, masked, pleasant, looks well HEENT - [place]/AT, sclera anicteric,  Car - RRR without m/g/r Pulm- RR and effort normal at rest, CTA without wheeze or rales Abd - soft, nontender diffusely including nontender in the left lower quadrant area and suprapubic area where he was painful previously  Back - no CVA tenderness,  Neuro/psychiatric - affect was not flat, appropriate with conversation             Alert with normal speech  Results for orders placed or performed in visit on 08/24/19  CBC with Differential/Platelet  Result Value Ref Range   WBC 5.9 3.8 - 10.8 Thousand/uL   RBC 5.04 4.20 - 5.80 Million/uL   Hemoglobin 14.9 13.2 - 17.1 g/dL   HCT 45.1 38 - 50 %   MCV 89.5 80.0 - 100.0 fL   MCH 29.6 27.0 - 33.0 pg   MCHC 33.0 32.0 - 36.0 g/dL   RDW 13.3 11.0 - 15.0 %   Platelets 245 140 - 400 Thousand/uL   MPV 10.8 7.5 - 12.5 fL   Neutro Abs 3,835 1,500 - 7,800 cells/uL   Lymphs Abs 1,269 850 - 3,900 cells/uL   Absolute Monocytes  502 200 - 950 cells/uL   Eosinophils Absolute 254 15 - 500 cells/uL   Basophils Absolute 41 0 - 200 cells/uL   Neutrophils Relative % 65 %  Total Lymphocyte 21.5 %   Monocytes Relative 8.5 %   Eosinophils Relative 4.3 %   Basophils Relative 0.7 %  BASIC METABOLIC PANEL WITH GFR  Result Value Ref Range   Glucose, Bld 103 (H) 65 - 99 mg/dL   BUN 22 7 - 25 mg/dL   Creat 1.37 (H) 0.70 - 1.11 mg/dL   GFR, Est Non African American 46 (L) > OR = 60 mL/min/1.74m2   GFR, Est African American 54 (L) > OR = 60 mL/min/1.16m2   BUN/Creatinine Ratio 16 6 - 22 (calc)   Sodium 140 135 - 146 mmol/L   Potassium 3.9 3.5 - 5.3 mmol/L   Chloride 104 98 - 110 mmol/L   CO2 26 20 - 32 mmol/L   Calcium 9.8 8.6 - 10.3 mg/dL  POCT Urinalysis Dipstick  Result Value Ref Range   Color, UA Dark yellow    Clarity, UA Clear    Glucose, UA Negative Negative   Bilirubin, UA Negative    Ketones, UA Negative    Spec Grav, UA 1.015 1.010 - 1.025   Blood, UA Negative    pH, UA 6.0 5.0 - 8.0   Protein, UA Negative Negative   Urobilinogen, UA 0.2 0.2 or 1.0 E.U./dL   Nitrite, UA Negative    Leukocytes, UA Negative Negative   Appearance Normal    Odor Normal    Last labs reviewed    Assessment & Plan:   1. Left lower quadrant abdominal pain 2. Acute left-sided low back pain without sciatica 3. Suprapubic pain Symptoms resolved shortly after our last visit, and have remained resolved since.  Exact source unclear, and did discuss a potential muscle source that could have been a contributor. We will continue to monitor presently.  4. Stage 3a chronic kidney disease His kidney function was mildly improved on last check from previous, and will continue to monitor.  He will keep his planned follow-up again in October, and follow-up sooner as needed.       Towanda Malkin, MD 08/30/19 10:48 AM

## 2019-08-30 ENCOUNTER — Encounter: Payer: Self-pay | Admitting: Internal Medicine

## 2019-08-30 ENCOUNTER — Other Ambulatory Visit: Payer: Self-pay | Admitting: Internal Medicine

## 2019-08-30 ENCOUNTER — Ambulatory Visit (INDEPENDENT_AMBULATORY_CARE_PROVIDER_SITE_OTHER): Payer: Medicare Other | Admitting: Internal Medicine

## 2019-08-30 ENCOUNTER — Other Ambulatory Visit: Payer: Self-pay

## 2019-08-30 VITALS — BP 136/88 | HR 58 | Temp 97.8°F | Resp 16 | Ht 72.0 in | Wt 220.4 lb

## 2019-08-30 DIAGNOSIS — R1032 Left lower quadrant pain: Secondary | ICD-10-CM

## 2019-08-30 DIAGNOSIS — N1831 Chronic kidney disease, stage 3a: Secondary | ICD-10-CM

## 2019-08-30 DIAGNOSIS — M545 Low back pain, unspecified: Secondary | ICD-10-CM

## 2019-08-30 DIAGNOSIS — R102 Pelvic and perineal pain: Secondary | ICD-10-CM

## 2019-09-29 ENCOUNTER — Telehealth: Payer: Self-pay

## 2019-09-29 ENCOUNTER — Other Ambulatory Visit: Payer: Self-pay | Admitting: Urology

## 2019-09-29 NOTE — Progress Notes (Signed)
What problems have you recently had with your health?Left back pain that has resolved with time.  What recent interventions/DTPs have been made by any provider since last CPP Visit?None, Metformin not started as A1C stable at 7.2 on 7/20 per provider's note. Any recent hospitalizations or ED visits since last visit with CPP?no What problems have you had recently with your pharmacy?NO Are they delivering your meds? Isabell Jarvis they being delivered on the same day?Yes What problems have you experienced getting refills? Yes,tamsulosin out of refills at pharmacy. Are you having to go without any meds due to price?NO What issues are side effects are you having with your medications? None What would you like me to pass along to [facility] for them to help you with?Nothing What can we do to take care of you better?Nothing currently.  Patient eats at [employer] every morning.  If he misses a day, they will call to look for him. He was enjoying watching TV when contacted. Pt is out of tamsulosin. Contacted patient's niece to coordinate a refill as patient has been out of medication since seeing Dr. Roxan Hockey in July and does not have a follow up appointment with Urology until January.

## 2019-10-18 DIAGNOSIS — Z8679 Personal history of other diseases of the circulatory system: Secondary | ICD-10-CM | POA: Diagnosis not present

## 2019-10-18 DIAGNOSIS — R0602 Shortness of breath: Secondary | ICD-10-CM | POA: Diagnosis not present

## 2019-10-18 DIAGNOSIS — I482 Chronic atrial fibrillation, unspecified: Secondary | ICD-10-CM | POA: Diagnosis not present

## 2019-10-18 DIAGNOSIS — I10 Essential (primary) hypertension: Secondary | ICD-10-CM | POA: Diagnosis not present

## 2019-10-18 DIAGNOSIS — E782 Mixed hyperlipidemia: Secondary | ICD-10-CM | POA: Diagnosis not present

## 2019-10-26 ENCOUNTER — Ambulatory Visit: Payer: Self-pay | Admitting: Internal Medicine

## 2019-10-31 NOTE — Progress Notes (Signed)
Patient ID: Charles Valencia, male    DOB: 1931-10-08, 86 y.o.   MRN: [id number]  PCP: Towanda Malkin, MD  Chief Complaint  Patient presents with  . Diabetes  . Hypertension  . Hyperlipidemia  . Atrial Fibrillation    Subjective:   Charles Valencia is a 86 y.o. male, presents to clinic with CC of the following:  Chief Complaint  Patient presents with  . Diabetes  . Hypertension  . Hyperlipidemia  . Atrial Fibrillation    HPI:  Patient is an 86 year old male former patient of Raelyn Ensign Follows up today after our July 26, 2019 appointment Communication on lab results after that July visit were as follows:   His lipid panel remains excellent. The complete metabolic panel showed that his kidney function has worsened some with a creatinine 1.48 and the GFR in the low 40s. The glucose was only slightly high at 103, the A1c was slightly higher at 7.2. The rest of the complete metabolic panel was normal with the liver function tests, electrolytes, and calcium level normal. The complete blood count was also entirely normal. I think it would be best to change his follow-up from 6 months time to 3 months time and recheck his kidney function at that time. (Labs were then rechecked 08/24/2019 with a specialist visit, and his creatinine was better at 1.37 on that check) Also please tell him to make sure he is staying well-hydrated in these warmer months and avoiding NSAIDs (products like ibuprofen which is Advil, Motrin, and Aleve products.).  He was last seen in August for left lower quadrant pain, with a follow-up visit a week after noting that had resolved.  Today he notes he is feeling well.  Very upbeat. He brought his medications with him today and thanked him for that.  He followed up with cardiology on 04/18/2019 with that note reviewed.  Their assessment/plan was as follows:     The patient has been maintained on anticoagulation with Eliquis and has been without any  falls since October of 2020. Most recent Cardiac diagnostic studies were performed in 2016 in which the Myoview was unremarkable and the echocardiogram was significant for normal LV function with an estimated EF of 50%, mild tricuspid and mitral valve insufficiency, mild RV enlargement, mild biatrial enlargement, mildly dilated aortic root and ascending aorta with mild LVH.  Plan  1. Chronic atrial fibrillation, rate controlled, on anticoagulation with Eliquis, recently stable, patient not on beta-blocker or rate control medication due to underlying bradycardia -We will continue anticoagulation with Eliquis. -We will obtain CBC to evaluate for any anemia.  2. Hypertension, reasonably controlled, patient is normotensive on today -We will continue management with valsartan-hydrochlorothiazide. -Recommend DASH diet.  3. Sick sinus syndrome, reasonably controlled, patient's heart rate on today is 73 bpm, pacemaker not indicated at this time -We will continue conservative management at this time.  4. Dyspnea, chronic, reasonably stable -We will schedule echocardiogram for current assessment of LV functioning and EF. -We will schedule Lexiscan/Myoview for current investigation of possible stress-induced myocardial ischemia.  Echo done 05/24/2019 had the following impression:   INTERPRETATION NORMAL LEFT VENTRICULAR SYSTOLIC FUNCTION WITH MILD LVH NORMAL RIGHT VENTRICULAR SYSTOLIC FUNCTION MILD VALVULAR REGURGITATION (See above) NO VALVULAR STENOSIS MILD MR, TR TRIVIAL PR EF 60%  myocardial perfusion scan done 05/14/2019 had the following impression:   Normal myocardial perfusion scan no evidence of stress-induced   myocardial ischemia ejection fraction of 57% conclusion negative scan   5. Hyperlipidemia,  reasonably controlled, no recent lipid profile on file in [facility] care including care everywhere. -We will continue management with Crestor 5 mg. -We will obtain blood laboratory work for  lipid profile.  6. Routine adult health maintenance -We will obtain CBC, CMP and lipid profile as the patient has not had any blood laboratory work performed since 2019. -We will obtain cardiac functional studies as the patient has not had any since 2016 and it is needed for ongoing evaluation of cardiac functioning.  7. Fall, resolved, patient denies any falls since October 2020 -Educated the patient on the importance of fall precautions and he verbalized understanding of all information.   Hypertension: Medication regimen-valsartan-HCTZ 160-12.5mg  daily.  Takes medications regularly  Not check Bp at home BP Readings from Last 3 Encounters:  11/01/19 116/80  08/30/19 136/88  08/24/19 134/88    denies chest pain, SOB, palp's, increased headaches; no BLE edema.No vision changes.   Hyperlipidemia: Medication regimen-rosuvastatin 5mg  nightly; Lab Results  Component Value Date   CHOL 122 07/26/2019   HDL 46 07/26/2019   LDLCALC 57 07/26/2019   TRIG 100 07/26/2019   CHOLHDL 2.7 07/26/2019   No myalgias;   GERD:Patient states is well controlled on nexium. No abdominal pain, black or dark stools.   Prostate Cancer:Seeing Dr. Erlene Quan with Urology for management, last visit was 06/22/2019 for his Eligard injection.  He has high risk locally advanced prostate cancer on ADT only for symptom control given age and comorbidities.  Tolerating ADT without difficulty, with Eligard prescribed at 65-month intervals.  Is taking flomax daily, 0.4 mg  Lab Results  Component Value Date   PSA1 <0.1 06/21/2019   PSA1 <0.1 12/17/2018   PSA1 <0.1 06/18/2018   PSA 16.9 (H) 09/10/2016    Diabetes: Medication regimen-none   On statin and ARB  Lab Results  Component Value Date   HGBA1C 7.2 (H) 07/26/2019   HGBA1C 6.9 (H) 10/27/2017   HGBA1C 6.8 (H) 04/27/2017   Lab Results  Component Value Date   MICROALBUR 5.5 01/25/2019   LDLCALC 57 07/26/2019   CREATININE 1.37 (H)  08/24/2019    denies polydipsia. No increased polyuria, does have some mild frequency due to enlarged prostate, often nocturia. Up once to twice overnight to urinate.  CKD-stage IIIa Lab Results  Component Value Date   CREATININE 1.37 (H) 08/24/2019   BUN 22 08/24/2019   NA 140 08/24/2019   K 3.9 08/24/2019   CL 104 08/24/2019   CO2 26 08/24/2019    Onychomycosis-sees podiatry, with last visit 07/07/2019.   Patient Active Problem List   Diagnosis Date Noted  . Stage 3a chronic kidney disease ([place]) 08/24/2019  . Coagulation disorder ([place]) 06/17/2018  . Type 2 diabetes mellitus with microalbuminuria, without long-term current use of insulin ([place]) 04/27/2017  . Prostate cancer ([place]) 09/12/2016  . GERD (gastroesophageal reflux disease) 09/13/2015  . Hyperlipidemia 09/13/2015  . Atrial fibrillation ([place]) 10/17/2014  . Bradycardia 06/14/2014  . BP (high blood pressure) 06/14/2014  . Sick sinus syndrome ([place]) 06/14/2014      Current Outpatient Medications:  .  aspirin 325 MG tablet, Take 81 mg by mouth daily. , Disp: , Rfl:  .  ELIQUIS 2.5 MG TABS tablet, TAKE 1 TABLET BY MOUTH TWICE A DAY, Disp: 180 tablet, Rfl: 1 .  esomeprazole (NEXIUM) 40 MG capsule, TAKE 1 CAPSULE BY MOUTH DAILY AT 12 NOON, Disp: 90 capsule, Rfl: 0 .  rosuvastatin (CRESTOR) 5 MG tablet, TAKE 1 TABLET BY MOUTH DAILY, Disp:  90 tablet, Rfl: 1 .  tamsulosin (FLOMAX) 0.4 MG CAPS capsule, TAKE 1 CAPSULE BY MOUTH EVERY DAY, Disp: 30 capsule, Rfl: 11 .  valsartan-hydrochlorothiazide (DIOVAN-HCT) 160-12.5 MG tablet, TAKE 1 TABLET BY MOUTH DAILY, Disp: 90 tablet, Rfl: 1   No Known Allergies   History reviewed. No pertinent surgical history.   Family History  Problem Relation Age of Onset  . Diabetes Sister   . Hyperlipidemia Sister   . Hypertension Sister   . Kidney disease Sister   . Alzheimer's disease Sister   . Bladder Cancer Neg Hx   . Kidney cancer Neg Hx   . AAA (abdominal aortic aneurysm) Neg Hx       Social History   Tobacco Use  . Smoking status: Former Smoker    Types: Cigarettes  . Smokeless tobacco: Never Used  . Tobacco comment: smoking cessation materials not required; poor historian, unable to recall smoking history  Substance Use Topics  . Alcohol use: No    Alcohol/week: 0.0 standard drinks    With staff assistance, above reviewed with the patient today.  ROS: As per HPI, otherwise no specific complaints on a limited and focused system review   Results for orders placed or performed in visit on 11/01/19 (from the past 72 hour(s))  POCT glycosylated hemoglobin (Hb A1C)     Status: Abnormal   Collection Time: 11/01/19  2:21 PM  Result Value Ref Range   Hemoglobin A1C 7.0 (A) 4.0 - 5.6 %   HbA1c POC (<> result, manual entry)     HbA1c, POC (prediabetic range)     HbA1c, POC (controlled diabetic range)       PHQ2/9: Depression screen [facility] 2/9 11/01/2019 08/30/2019 08/24/2019 07/26/2019 06/02/2019  Decreased Interest 0 0 0 0 0  Down, Depressed, Hopeless 0 0 0 0 0  PHQ - 2 Score 0 0 0 0 0  Altered sleeping - - 0 0 -  Tired, decreased energy - - 1 0 -  Change in appetite - - 0 0 -  Feeling bad or failure about yourself  - - 0 0 -  Trouble concentrating - - 0 0 -  Moving slowly or fidgety/restless - - 0 0 -  Suicidal thoughts - - 0 0 -  PHQ-9 Score - - 1 0 -  Difficult doing work/chores - - Not difficult at all Not difficult at all -  Some recent data might be hidden   PHQ-2/9 Result is neg   Fall Risk: Fall Risk  11/01/2019 08/30/2019 08/24/2019 07/26/2019 06/02/2019  Falls in the past year? 0 0 0 0 0  Number falls in past yr: 0 0 0 0 0  Injury with Fall? 0 0 0 0 0  Comment - - - - -  Risk for fall due to : - - - - No Fall Risks  Risk for fall due to: Comment - - - - -  Follow up - - - - Falls prevention discussed      Objective:   Vitals:   11/01/19 1404  BP: 116/80  Pulse: 68  Resp: 16  Temp: (!) 97.4 F (36.3 C)  TempSrc: Oral  SpO2: 98%    Weight: 217 lb (98.4 kg)  Height: 6' (1.829 m)    Body mass index is 29.43 kg/m.  Physical Exam   NAD, masked,  very pleasant HEENT - [place]/AT, sclera anicteric, PERRL, EOMI, conj - non-inj'ed,  pharynx clear, TMs and canals clear with no marked cerumen in the  canals bilaterally.  The left TM had a slight scarring appearance compared to the right with a light reflex not quite as crisp. Neck - supple, no adenopathy, carotids 2+ and = without bruits bilat Car -heart rate slightly irregular, no S3 noted, Pulm- RR and effort normal at rest, CTA without wheeze or rales Abd - soft, NT diffusely, mildly obese, ND,  Back - no CVA tenderness Ext - no LE edema,  Neuro/psychiatric - affect was not flat, appropriate with conversation             Alert with normal speech             Grossly non-focal                Results for orders placed or performed in visit on 11/01/19  POCT glycosylated hemoglobin (Hb A1C)  Result Value Ref Range   Hemoglobin A1C 7.0 (A) 4.0 - 5.6 %   HbA1c POC (<> result, manual entry)     HbA1c, POC (prediabetic range)     HbA1c, POC (controlled diabetic range)     Last labs reviewed A1c in the office today was 7.0    Assessment & Plan:    1. Essential hypertension Blood pressure remains controlled on his current medication regimen to continue. Continue with cardiology input as well.  2. Mixed hyperlipidemia Recent lipid panel reviewed from July and all good. Continue the statin product.  3. Type 2 diabetes mellitus with microalbuminuria, without long-term current use of insulin (HCC) A1c checked today in the office was slightly better at 7.0. Noted goal with him today is trying to keep the A1c between 6 and 7, although extremely tight control his sugars less critical presently, and noting the risk/benefit of adding a product like Metformin to his regimen presently, feel best to remain off of this medicine at this time. Continue to follow - POCT glycosylated  hemoglobin (Hb A1C)  4. Gastroesophageal reflux disease, unspecified whether esophagitis present Reflux remains well controlled presently with the PPI  5. Prostate cancer ([place]) Continues to follow with urology.  6. Paroxysmal atrial fibrillation (HCC) 7. Sick sinus syndrome (HCC) Continues to follow with cardiology Did have a recent echo and scan as noted above Remains on Eliquis  8. Stage 3a chronic kidney disease ([place]) Most recent check in August revealed the kidney function to be slightly improved from the 1 prior in July. Can recheck labs on his follow-up visit Continuing to stay well-hydrated and avoiding NSAIDs helpful.   He did get the flu shot today. He noted he also wanted to get his ear canals irrigated, although there was no cerumen in the canals to proceed with that today and explained that to him   Tentatively, we will schedule follow-up again in February, as feel best to recheck his kidney function again in about 6 months after the last check in August.  Also can recheck an A1c at that time.  To follow-up sooner as needed.    Towanda Malkin, MD 11/01/19 2:31 PM

## 2019-11-01 ENCOUNTER — Other Ambulatory Visit: Payer: Self-pay

## 2019-11-01 ENCOUNTER — Ambulatory Visit (INDEPENDENT_AMBULATORY_CARE_PROVIDER_SITE_OTHER): Payer: Medicare Other | Admitting: Internal Medicine

## 2019-11-01 ENCOUNTER — Encounter: Payer: Self-pay | Admitting: Internal Medicine

## 2019-11-01 VITALS — BP 116/80 | HR 68 | Temp 97.4°F | Resp 16 | Ht 72.0 in | Wt 217.0 lb

## 2019-11-01 DIAGNOSIS — E782 Mixed hyperlipidemia: Secondary | ICD-10-CM | POA: Diagnosis not present

## 2019-11-01 DIAGNOSIS — I495 Sick sinus syndrome: Secondary | ICD-10-CM

## 2019-11-01 DIAGNOSIS — Z23 Encounter for immunization: Secondary | ICD-10-CM

## 2019-11-01 DIAGNOSIS — I480 Paroxysmal atrial fibrillation: Secondary | ICD-10-CM

## 2019-11-01 DIAGNOSIS — I10 Essential (primary) hypertension: Secondary | ICD-10-CM

## 2019-11-01 DIAGNOSIS — K219 Gastro-esophageal reflux disease without esophagitis: Secondary | ICD-10-CM

## 2019-11-01 DIAGNOSIS — E1129 Type 2 diabetes mellitus with other diabetic kidney complication: Secondary | ICD-10-CM | POA: Diagnosis not present

## 2019-11-01 DIAGNOSIS — R809 Proteinuria, unspecified: Secondary | ICD-10-CM | POA: Diagnosis not present

## 2019-11-01 DIAGNOSIS — N1831 Chronic kidney disease, stage 3a: Secondary | ICD-10-CM

## 2019-11-01 DIAGNOSIS — C61 Malignant neoplasm of prostate: Secondary | ICD-10-CM

## 2019-11-01 LAB — POCT GLYCOSYLATED HEMOGLOBIN (HGB A1C): Hemoglobin A1C: 7 % — AB (ref 4.0–5.6)

## 2019-11-07 ENCOUNTER — Encounter: Payer: Self-pay | Admitting: Podiatry

## 2019-11-07 ENCOUNTER — Ambulatory Visit (INDEPENDENT_AMBULATORY_CARE_PROVIDER_SITE_OTHER): Payer: Medicare Other | Admitting: Podiatry

## 2019-11-07 ENCOUNTER — Other Ambulatory Visit: Payer: Self-pay

## 2019-11-07 DIAGNOSIS — M79675 Pain in left toe(s): Secondary | ICD-10-CM | POA: Diagnosis not present

## 2019-11-07 DIAGNOSIS — M79674 Pain in right toe(s): Secondary | ICD-10-CM | POA: Diagnosis not present

## 2019-11-07 DIAGNOSIS — E119 Type 2 diabetes mellitus without complications: Secondary | ICD-10-CM | POA: Diagnosis not present

## 2019-11-07 DIAGNOSIS — D689 Coagulation defect, unspecified: Secondary | ICD-10-CM

## 2019-11-07 DIAGNOSIS — B351 Tinea unguium: Secondary | ICD-10-CM | POA: Diagnosis not present

## 2019-11-07 NOTE — Progress Notes (Signed)
This patient returns to my office for at risk foot care.  This patient requires this care by a professional since this patient will be at risk due to having diabetes,chronic kidney disease and coagulation defect.  Patient is taking eliquiss.  This patient is unable to cut nails himself since the patient cannot reach his nails.These nails are painful walking and wearing shoes.  This patient presents for at risk foot care today.  General Appearance  Alert, conversant and in no acute stress.  Vascular  Dorsalis pedis and posterior tibial  pulses are palpable  bilaterally.  Capillary return is within normal limits  bilaterally. Temperature is within normal limits  bilaterally.  Neurologic  Senn-Weinstein monofilament wire test within normal limits  bilaterally. Muscle power within normal limits bilaterally.  Nails Thick disfigured discolored nails with subungual debris  from hallux to fifth toes bilaterally. No evidence of bacterial infection or drainage bilaterally.  Orthopedic  No limitations of motion  feet .  No crepitus or effusions noted.  No bony pathology or digital deformities noted.  Skin  normotropic skin with no porokeratosis noted bilaterally.  No signs of infections or ulcers noted.     Onychomycosis  Pain in right toes  Pain in left toes  Consent was obtained for treatment procedures.   Mechanical debridement of nails 1-5  bilaterally performed with a nail nipper.  Filed with dremel without incident.    Return office visit     3 months                Told patient to return for periodic foot care and evaluation due to potential at risk complications.   Gardiner Barefoot DPM

## 2019-11-29 ENCOUNTER — Other Ambulatory Visit: Payer: Self-pay | Admitting: Internal Medicine

## 2019-11-29 DIAGNOSIS — I480 Paroxysmal atrial fibrillation: Secondary | ICD-10-CM

## 2019-12-14 ENCOUNTER — Telehealth: Payer: Self-pay | Admitting: *Deleted

## 2019-12-14 NOTE — Chronic Care Management (AMB) (Signed)
  Care Management   Note  12/14/2019 Name: Charles Valencia MRN: [id number] DOB: [birth year]  Charles Valencia is a 86 y.o. year old male who is a primary care patient of Towanda Malkin, MD and is actively engaged with the care management team. I reached out to [facility] by phone today to assist with scheduling a follow up visit with the Pharmacist  Follow up plan: Face to Face appointment with care management team member scheduled for:  01/12/2020  [facility]  Direct Dial [phone]

## 2019-12-26 ENCOUNTER — Other Ambulatory Visit: Payer: Medicare Other

## 2019-12-26 ENCOUNTER — Other Ambulatory Visit: Payer: Self-pay

## 2019-12-26 DIAGNOSIS — C61 Malignant neoplasm of prostate: Secondary | ICD-10-CM

## 2019-12-27 LAB — PSA: Prostate Specific Ag, Serum: <0.1 ng/mL (ref 0.0–4.0)

## 2020-01-11 ENCOUNTER — Ambulatory Visit: Payer: Self-pay | Admitting: Urology

## 2020-01-12 ENCOUNTER — Ambulatory Visit: Payer: Medicare Other

## 2020-01-12 NOTE — Chronic Care Management (AMB) (Deleted)
Chronic Care Management Pharmacy  Name: Charles Valencia  MRN: [id number] DOB: 1931/03/17  Chief Complaint/ HPI  Charles Valencia,  87 y.o. , male presents for their Follow-Up CCM visit with the clinical pharmacist via telephone.  PCP : Towanda Malkin, MD  Their chronic conditions include: SSS, HLD, DM, HTN   Afib, gerd, CKD,   Office Visits: 11/01/19: Patient presented to Dr. Roxan Hockey for follow-up. A1c improved to 7.0%. No medication changes made.    Consult Visit:  10/18/19: Patient presented to Gladstone Pih, NP (Cardiology) for follow-up.   Medications: Outpatient Encounter Medications as of 01/12/2020  Medication Sig  . aspirin 325 MG tablet Take 81 mg by mouth daily.   Marland Kitchen ELIQUIS 2.5 MG TABS tablet TAKE 1 TABLET BY MOUTH TWICE A DAY  . esomeprazole (NEXIUM) 40 MG capsule TAKE 1 CAPSULE BY MOUTH DAILY AT 12 NOON  . rosuvastatin (CRESTOR) 5 MG tablet TAKE 1 TABLET BY MOUTH DAILY  . tamsulosin (FLOMAX) 0.4 MG CAPS capsule TAKE 1 CAPSULE BY MOUTH EVERY DAY  . valsartan-hydrochlorothiazide (DIOVAN-HCT) 160-12.5 MG tablet TAKE 1 TABLET BY MOUTH DAILY   No facility-administered encounter medications on file as of 01/12/2020.    Financial Resource Strain: Low Risk   . Difficulty of Paying Living Expenses: Not hard at all   Current Diagnosis/Assessment:  Goals Addressed   None    Hypertension   BP goal is:  {CHL HP UPSTREAM Pharmacist BP ranges:[phone]}  Office blood pressures are  BP Readings from Last 3 Encounters:  11/01/19 116/80  08/30/19 136/88  08/24/19 134/88   Patient checks BP at home {CHL HP BP Monitoring Frequency:[phone]} Patient home BP readings are ranging: ***  Patient has failed these meds in the past: *** Patient is currently {CHL Controlled/Uncontrolled:[phone]} on the following medications:  . Valsartan-HCTZ 160-12.5 mg daily   We discussed {CHL HP Upstream Pharmacy discussion:[id number]}  Plan  Continue {CHL HP Upstream  Pharmacy Plans:[phone]}   AFIB   Patient is currently {CHL HP BP RATE/RHYTHM:[phone]} controlled. HR *** BPM  Patient has failed these meds in past: *** Patient is currently {CHL Controlled/Uncontrolled:[phone]} on the following medications:  . Eliquis 2.5 mg twice daily  .  We discussed:  {CHL HP Upstream Pharmacy discussion:[id number]}  Plan  Continue {CHL HP Upstream Pharmacy Plans:[phone]}    Hyperlipidemia   Lipid Panel     Component Value Date/Time   CHOL 122 07/26/2019 1354   TRIG 100 07/26/2019 1354   HDL 46 07/26/2019 1354   LDLCALC 57 07/26/2019 1354     The ASCVD Risk score (Goff DC Jr., et al., 2013) failed to calculate for the following reasons:   The 2013 ASCVD risk score is only valid for ages 38 to 64   Patient has failed these meds in past: NA Patient is currently controlled on the following medications:  . Aspirin 81 mg daily  . Rosuvastatin 5mg  daily  We discussed:   At goal  Plan  Continue current medications  Diabetes   Recent Relevant Labs: Lab Results  Component Value Date/Time   HGBA1C 7.0 (A) 11/01/2019 02:21 PM   HGBA1C 7.2 (H) 07/26/2019 01:54 PM   HGBA1C 6.9 (H) 10/27/2017 02:09 PM   MICROALBUR 5.5 01/25/2019 12:04 PM   MICROALBUR 11.4 10/27/2017 02:09 PM    Checking BG: Never   Patient is currently controlled on the following medications: :  . None  Last diabetic Foot exam:  Lab Results  Component Value Date/Time   HMDIABEYEEXA No Retinopathy 06/18/2017  12:00 AM    Last diabetic Eye exam: No results found for: HMDIABFOOTEX   We discussed:   Plan  Continue control with diet and exercise   BPH   PSA  Date Value Ref Range Status  09/10/2016 16.9 (H) < OR = 4.0 ng/mL Final    Comment:    The total PSA value from this assay system is  standardized against the WHO standard. The test  result will be approximately 20% lower when compared  to the equimolar-standardized total PSA (Beckman  Coulter).  Comparison of serial PSA results should be  interpreted with this fact in mind. . This test was performed using the Siemens  chemiluminescent method. Values obtained from  different assay methods cannot be used interchangeably. PSA levels, regardless of value, should not be interpreted as absolute evidence of the presence or absence of disease.      Patient has failed these meds in past: *** Patient is currently {CHL Controlled/Uncontrolled:[phone]} on the following medications:  . Tamsulosin 0.4 mg daily   We discussed:  ***  Plan  Continue {CHL HP Upstream Pharmacy BFXOV:[id number]}   Medication Management   Pt uses [facility] pharmacy for all medications Uses pill box? Yes Pt endorses 100% compliance Not interested in UpStream  We discussed:  Has tried weaning off Nexium before. Failed. Didn't want to talk. Says everything is fine. Wanted to go back to watching TV.  Plan  Continue current medication management strategy  Follow up: 6 months  Felton Clinton, PharmD, [place], CTTS Clinical Pharmacist [facility] [phone]

## 2020-01-17 ENCOUNTER — Ambulatory Visit (INDEPENDENT_AMBULATORY_CARE_PROVIDER_SITE_OTHER): Payer: Medicare Other | Admitting: Urology

## 2020-01-17 ENCOUNTER — Other Ambulatory Visit: Payer: Self-pay

## 2020-01-17 VITALS — BP 166/85 | HR 80 | Wt 217.0 lb

## 2020-01-17 DIAGNOSIS — N400 Enlarged prostate without lower urinary tract symptoms: Secondary | ICD-10-CM

## 2020-01-17 DIAGNOSIS — C61 Malignant neoplasm of prostate: Secondary | ICD-10-CM | POA: Diagnosis not present

## 2020-01-17 MED ORDER — LEUPROLIDE ACETATE (4 MONTH) 30 MG ~~LOC~~ KIT: 30.0000 mg | PACK | Freq: Once | SUBCUTANEOUS | Status: DC

## 2020-01-17 MED ORDER — LEUPROLIDE ACETATE (6 MONTH) 45 MG ~~LOC~~ KIT
45.0000 mg | PACK | Freq: Once | SUBCUTANEOUS | Status: AC
  Administered 2020-01-17: 45 mg via SUBCUTANEOUS

## 2020-01-17 NOTE — Progress Notes (Signed)
Eligard SubQ Injection   Due to Prostate Cancer patient is present today for a Eligard Injection.  Medication: Eligard 6 month Dose: 45 mg  Location: right  Lot: [id number] Exp: 05/2021  Patient tolerated well, no complications were noted  Performed by: Kerman Passey, RMA  Per Dr. Erlene Quan patient is to continue therapy for 6 months . Patient's next follow up was scheduled for 6 mo. This appointment was scheduled using wheel and given to patient today along with reminder continue on Vitamin D 800-1000iu and Calium 1000-1200mg  daily while on Androgen Deprivation Therapy.  PA approval dates:

## 2020-01-17 NOTE — Patient Instructions (Signed)
continue on Vitamin D 800-1000iu and Calium 1000-1200mg daily while on Androgen Deprivation Therapy.   

## 2020-01-20 ENCOUNTER — Telehealth: Payer: Self-pay

## 2020-01-20 NOTE — Progress Notes (Signed)
01/17/2020 3:35 PM   Charles Valencia December 23, 1931 [id number]  Referring provider: Towanda Malkin, MD [address] [place],  [place] [address]   HPI: Charles Valencia is a 87 y.o. M with high risk clinically symptomatic prostate cancer on ADT only. He returns today for 41-month follow-up with PSA prior.  He is due for leuprolide Depo today.  He initially presented in 10/2016 with elevated PSA to 16.9.PSA was checked by his PCP on 09/10/2016 and markedly elevated to 16.9. Last known PSA in 2012 8.4.Rectal exam did show an enlarged gland which was massively enlarged with induration on the right lateral ridge without a discrete nodule although the exam was somewhat limited to habitus.PSA was repeated on 01/12/2017 aroseto 35.7.  Patient was extremely hesitant to undergo prostate biopsy due to concern for the enema thus this was delayed until 04/15/2017. Ultimately prostate biopsy on this day revealed high risk prostate cancer,Gleason 4+5 up to 100% of the tissue including allbiopsiesright side of the biopsy in 2 of the left.TRUS vol 53.  He underwent staging with CT scan and bone scan4/2019both of which are negative for any evidence of metastatic disease. Notably, there is borderline prominence of the seminal vesicles but otherwise no lymphadenopathy.  Given his age and comorbidities, he was started on ADT alone in the form of leuprolide Depo.  He is due for this today.  His PSA remains undetectable.  He denies any issues.  He is taking calcium and vitamin D supplementation and walking as his exercise.  Hot flashes are minimal/manageable.  He does also have a personal history of BPH.  He denies any progression or changes in his urinary symptoms.  He is overall pleased.  He continues to take Flomax.  PMH: Past Medical History:  Diagnosis Date  . Diabetes ([place])   . GERD (gastroesophageal reflux disease)   . Glaucoma   . Hyperlipidemia   . Hypertension   .  Paroxysmal atrial fibrillation ([place])    Followed by Cardiology.    Surgical History: No past surgical history on file.  Home Medications:  Allergies as of 01/17/2020   No Known Allergies     Medication List       Accurate as of January 17, 2020 11:59 PM. If you have any questions, ask your nurse or doctor.        aspirin 325 MG tablet Take 81 mg by mouth daily.   Eliquis 2.5 MG Tabs tablet Generic drug: apixaban TAKE 1 TABLET BY MOUTH TWICE A DAY   esomeprazole 40 MG capsule Commonly known as: NEXIUM TAKE 1 CAPSULE BY MOUTH DAILY AT 12 NOON   rosuvastatin 5 MG tablet Commonly known as: CRESTOR TAKE 1 TABLET BY MOUTH DAILY   tamsulosin 0.4 MG Caps capsule Commonly known as: FLOMAX TAKE 1 CAPSULE BY MOUTH EVERY DAY   valsartan-hydrochlorothiazide 160-12.5 MG tablet Commonly known as: DIOVAN-HCT TAKE 1 TABLET BY MOUTH DAILY       Allergies: No Known Allergies  Family History: Family History  Problem Relation Age of Onset  . Diabetes Sister   . Hyperlipidemia Sister   . Hypertension Sister   . Kidney disease Sister   . Alzheimer's disease Sister   . Bladder Cancer Neg Hx   . Kidney cancer Neg Hx   . AAA (abdominal aortic aneurysm) Neg Hx     Social History:  reports that he has quit smoking. His smoking use included cigarettes. He has never used smokeless tobacco. He reports that he does  not drink alcohol and does not use drugs.   Physical Exam: BP (!) 166/85   Pulse 80   Wt 217 lb (98.4 kg)   BMI 29.43 kg/m   Constitutional:  Alert and oriented, No acute distress. HEENT: [place] AT, moist mucus membranes.  Trachea midline, no masses. Cardiovascular: No clubbing, cyanosis, or edema. Respiratory: Normal respiratory effort, no increased work of breathing. Skin: No rashes, bruises or suspicious lesions. Neurologic: Grossly intact, no focal deficits, moving all 4 extremities. Psychiatric: Normal mood and affect.  Assessment & Plan:    1. Prostate  cancer  High risk locally advanced prostate cancer on ADT only for symptom control given age and comorrbidities  Tolerating ADT without difficulty, 80-month leuprolide given today in the form of Eligard  Reviewed bone health and weightbearing exercise recommendations.  Plan to continue ADT indefinitely, although may consider intermittent ADT down the road   2. BPH Continue flomax, urinary symptoms stable  Return in 6 months for PSA/ADT   [name], MD  [facility] [address], [place],  [address] [phone]

## 2020-01-20 NOTE — Telephone Encounter (Signed)
PA for [employer], Auth [id number]. Dates 01/20/20 - 01/19/21.

## 2020-01-26 ENCOUNTER — Ambulatory Visit: Payer: Medicare Other | Admitting: Internal Medicine

## 2020-02-01 ENCOUNTER — Ambulatory Visit: Payer: Medicare Other | Admitting: Internal Medicine

## 2020-02-02 ENCOUNTER — Telehealth: Payer: Self-pay

## 2020-02-02 NOTE — Progress Notes (Signed)
    Chronic Care Management Pharmacy Assistant   Name: Charles Valencia  MRN: [id number] DOB: 1931/04/09  Reason for Encounter: Medication Review  PCP : Towanda Malkin, MD  Allergies:  No Known Allergies  Medications: Outpatient Encounter Medications as of 02/02/2020  Medication Sig  . aspirin 325 MG tablet Take 81 mg by mouth daily.   Marland Kitchen ELIQUIS 2.5 MG TABS tablet TAKE 1 TABLET BY MOUTH TWICE A DAY  . esomeprazole (NEXIUM) 40 MG capsule TAKE 1 CAPSULE BY MOUTH DAILY AT 12 NOON  . rosuvastatin (CRESTOR) 5 MG tablet TAKE 1 TABLET BY MOUTH DAILY  . tamsulosin (FLOMAX) 0.4 MG CAPS capsule TAKE 1 CAPSULE BY MOUTH EVERY DAY  . valsartan-hydrochlorothiazide (DIOVAN-HCT) 160-12.5 MG tablet TAKE 1 TABLET BY MOUTH DAILY   No facility-administered encounter medications on file as of 02/02/2020.    Current Diagnosis: Patient Active Problem List   Diagnosis Date Noted  . Stage 3a chronic kidney disease ([place]) 08/24/2019  . Coagulation disorder ([place]) 06/17/2018  . Type 2 diabetes mellitus with microalbuminuria, without long-term current use of insulin ([place]) 04/27/2017  . Prostate cancer ([place]) 09/12/2016  . GERD (gastroesophageal reflux disease) 09/13/2015  . Hyperlipidemia 09/13/2015  . Atrial fibrillation ([place]) 10/17/2014  . Bradycardia 06/14/2014  . BP (high blood pressure) 06/14/2014  . Sick sinus syndrome ([place]) 06/14/2014    Goals Addressed   None     Reviewed chart and adherence measures. Per insurance data patient is 90-99 % adherent to Rosuvastatin for cholesterol and 90-99% adherent to Valsartan/HCTZ for hypertension .   Follow-Up:  Pharmacist Review   Bessie [place] Pharmacist Assistant [phone]

## 2020-02-09 ENCOUNTER — Other Ambulatory Visit: Payer: Self-pay

## 2020-02-09 ENCOUNTER — Encounter: Payer: Self-pay | Admitting: Family Medicine

## 2020-02-09 ENCOUNTER — Ambulatory Visit (INDEPENDENT_AMBULATORY_CARE_PROVIDER_SITE_OTHER): Payer: Medicare Other | Admitting: Family Medicine

## 2020-02-09 VITALS — BP 136/84 | HR 73 | Temp 97.8°F | Resp 18 | Ht 72.0 in | Wt 220.6 lb

## 2020-02-09 DIAGNOSIS — E785 Hyperlipidemia, unspecified: Secondary | ICD-10-CM | POA: Diagnosis not present

## 2020-02-09 DIAGNOSIS — E1129 Type 2 diabetes mellitus with other diabetic kidney complication: Secondary | ICD-10-CM

## 2020-02-09 DIAGNOSIS — I480 Paroxysmal atrial fibrillation: Secondary | ICD-10-CM | POA: Diagnosis not present

## 2020-02-09 DIAGNOSIS — R809 Proteinuria, unspecified: Secondary | ICD-10-CM | POA: Diagnosis not present

## 2020-02-09 DIAGNOSIS — Z7901 Long term (current) use of anticoagulants: Secondary | ICD-10-CM

## 2020-02-09 DIAGNOSIS — D689 Coagulation defect, unspecified: Secondary | ICD-10-CM

## 2020-02-09 DIAGNOSIS — K219 Gastro-esophageal reflux disease without esophagitis: Secondary | ICD-10-CM

## 2020-02-09 DIAGNOSIS — E782 Mixed hyperlipidemia: Secondary | ICD-10-CM | POA: Diagnosis not present

## 2020-02-09 DIAGNOSIS — N1831 Chronic kidney disease, stage 3a: Secondary | ICD-10-CM | POA: Diagnosis not present

## 2020-02-09 DIAGNOSIS — I10 Essential (primary) hypertension: Secondary | ICD-10-CM | POA: Diagnosis not present

## 2020-02-09 MED ORDER — ROSUVASTATIN CALCIUM 5 MG PO TABS: 5.0000 mg | ORAL_TABLET | Freq: Every day | ORAL | 3 refills | Status: AC

## 2020-02-09 MED ORDER — ESOMEPRAZOLE MAGNESIUM 40 MG PO CPDR: 40.0000 mg | DELAYED_RELEASE_CAPSULE | Freq: Every day | ORAL | 1 refills | Status: AC | PRN

## 2020-02-09 MED ORDER — VALSARTAN-HYDROCHLOROTHIAZIDE 160-12.5 MG PO TABS
1.0000 | ORAL_TABLET | Freq: Every day | ORAL | 1 refills | Status: DC
Start: 2020-02-09 — End: 2020-08-20

## 2020-02-09 MED ORDER — APIXABAN 2.5 MG PO TABS
2.5000 mg | ORAL_TABLET | Freq: Two times a day (BID) | ORAL | 1 refills | Status: DC
Start: 2020-02-09 — End: 2020-08-20

## 2020-02-09 NOTE — Patient Instructions (Signed)
DASH Eating Plan DASH stands for Dietary Approaches to Stop Hypertension. The DASH eating plan is a healthy eating plan that has been shown to:  Reduce high blood pressure (hypertension).  Reduce your risk for type 2 diabetes, heart disease, and stroke.  Help with weight loss. What are tips for following this plan? Reading food labels  Check food labels for the amount of salt (sodium) per serving. Choose foods with less than 5 percent of the Daily Value of sodium. Generally, foods with less than 300 milligrams (mg) of sodium per serving fit into this eating plan.  To find whole grains, look for the word "whole" as the first word in the ingredient list. Shopping  Buy products labeled as "low-sodium" or "no salt added."  Buy fresh foods. Avoid canned foods and pre-made or frozen meals. Cooking  Avoid adding salt when cooking. Use salt-free seasonings or herbs instead of table salt or sea salt. Check with your health care provider or pharmacist before using salt substitutes.  Do not fry foods. Cook foods using healthy methods such as baking, boiling, grilling, roasting, and broiling instead.  Cook with heart-healthy oils, such as olive, canola, avocado, soybean, or sunflower oil. Meal planning  Eat a balanced diet that includes: ? 4 or more servings of fruits and 4 or more servings of vegetables each day. Try to fill one-half of your plate with fruits and vegetables. ? 6-8 servings of whole grains each day. ? Less than 6 oz (170 g) of lean meat, poultry, or fish each day. A 3-oz (85-g) serving of meat is about the same size as a deck of cards. One egg equals 1 oz (28 g). ? 2-3 servings of low-fat dairy each day. One serving is 1 cup (237 mL). ? 1 serving of nuts, seeds, or beans 5 times each week. ? 2-3 servings of heart-healthy fats. Healthy fats called omega-3 fatty acids are found in foods such as walnuts, flaxseeds, fortified milks, and eggs. These fats are also found in  cold-water fish, such as sardines, salmon, and mackerel.  Limit how much you eat of: ? Canned or prepackaged foods. ? Food that is high in trans fat, such as some fried foods. ? Food that is high in saturated fat, such as fatty meat. ? Desserts and other sweets, sugary drinks, and other foods with added sugar. ? Full-fat dairy products.  Do not salt foods before eating.  Do not eat more than 4 egg yolks a week.  Try to eat at least 2 vegetarian meals a week.  Eat more home-cooked food and less restaurant, buffet, and fast food.   Lifestyle  When eating at a restaurant, ask that your food be prepared with less salt or no salt, if possible.  If you drink alcohol: ? Limit how much you use to:  0-1 drink a day for women who are not pregnant.  0-2 drinks a day for men. ? Be aware of how much alcohol is in your drink. In the [place] one drink equals one 12 oz bottle of beer (355 mL), one 5 oz glass of wine (148 mL), or one 1 oz glass of hard liquor (44 mL). General information  Avoid eating more than 2,300 mg of salt a day. If you have hypertension, you may need to reduce your sodium intake to 1,500 mg a day.  Work with your health care provider to maintain a healthy body weight or to lose weight. Ask what an ideal weight is for you.    Get at least 30 minutes of exercise that causes your heart to beat faster (aerobic exercise) most days of the week. Activities may include walking, swimming, or biking.  Work with your health care provider or dietitian to adjust your eating plan to your individual calorie needs. What foods should I eat? Fruits All fresh, dried, or frozen fruit. Canned fruit in natural juice (without added sugar). Vegetables Fresh or frozen vegetables (raw, steamed, roasted, or grilled). Low-sodium or reduced-sodium tomato and vegetable juice. Low-sodium or reduced-sodium tomato sauce and tomato paste. Low-sodium or reduced-sodium canned vegetables. Grains Whole-grain  or whole-wheat bread. Whole-grain or whole-wheat pasta. Brown rice. Oatmeal. Quinoa. Bulgur. Whole-grain and low-sodium cereals. Pita bread. Low-fat, low-sodium crackers. Whole-wheat flour tortillas. Meats and other proteins Skinless chicken or turkey. Ground chicken or turkey. Pork with fat trimmed off. Fish and seafood. Egg whites. Dried beans, peas, or lentils. Unsalted nuts, nut butters, and seeds. Unsalted canned beans. Lean cuts of beef with fat trimmed off. Low-sodium, lean precooked or cured meat, such as sausages or meat loaves. Dairy Low-fat (1%) or fat-free (skim) milk. Reduced-fat, low-fat, or fat-free cheeses. Nonfat, low-sodium ricotta or cottage cheese. Low-fat or nonfat yogurt. Low-fat, low-sodium cheese. Fats and oils Soft margarine without trans fats. Vegetable oil. Reduced-fat, low-fat, or light mayonnaise and salad dressings (reduced-sodium). Canola, safflower, olive, avocado, soybean, and sunflower oils. Avocado. Seasonings and condiments Herbs. Spices. Seasoning mixes without salt. Other foods Unsalted popcorn and pretzels. Fat-free sweets. The items listed above may not be a complete list of foods and beverages you can eat. Contact a dietitian for more information. What foods should I avoid? Fruits Canned fruit in a light or heavy syrup. Fried fruit. Fruit in cream or butter sauce. Vegetables Creamed or fried vegetables. Vegetables in a cheese sauce. Regular canned vegetables (not low-sodium or reduced-sodium). Regular canned tomato sauce and paste (not low-sodium or reduced-sodium). Regular tomato and vegetable juice (not low-sodium or reduced-sodium). Pickles. Olives. Grains Baked goods made with fat, such as croissants, muffins, or some breads. Dry pasta or rice meal packs. Meats and other proteins Fatty cuts of meat. Ribs. Fried meat. Bacon. Bologna, salami, and other precooked or cured meats, such as sausages or meat loaves. Fat from the back of a pig (fatback).  Bratwurst. Salted nuts and seeds. Canned beans with added salt. Canned or smoked fish. Whole eggs or egg yolks. Chicken or turkey with skin. Dairy Whole or 2% milk, cream, and half-and-half. Whole or full-fat cream cheese. Whole-fat or sweetened yogurt. Full-fat cheese. Nondairy creamers. Whipped toppings. Processed cheese and cheese spreads. Fats and oils Butter. Stick margarine. Lard. Shortening. Ghee. Bacon fat. Tropical oils, such as coconut, palm kernel, or palm oil. Seasonings and condiments Onion salt, garlic salt, seasoned salt, table salt, and sea salt. Worcestershire sauce. Tartar sauce. Barbecue sauce. Teriyaki sauce. Soy sauce, including reduced-sodium. Steak sauce. Canned and packaged gravies. Fish sauce. Oyster sauce. Cocktail sauce. Store-bought horseradish. Ketchup. Mustard. Meat flavorings and tenderizers. Bouillon cubes. Hot sauces. Pre-made or packaged marinades. Pre-made or packaged taco seasonings. Relishes. Regular salad dressings. Other foods Salted popcorn and pretzels. The items listed above may not be a complete list of foods and beverages you should avoid. Contact a dietitian for more information. Where to find more information  National Heart, Lung, and Blood Institute: www.nhlbi.nih.gov  American Heart Association: www.heart.org  Academy of Nutrition and Dietetics: www.eatright.org  National Kidney Foundation: www.kidney.org Summary  The DASH eating plan is a healthy eating plan that has been shown to reduce high   blood pressure (hypertension). It may also reduce your risk for type 2 diabetes, heart disease, and stroke.  When on the DASH eating plan, aim to eat more fresh fruits and vegetables, whole grains, lean proteins, low-fat dairy, and heart-healthy fats.  With the DASH eating plan, you should limit salt (sodium) intake to 2,300 mg a day. If you have hypertension, you may need to reduce your sodium intake to 1,500 mg a day.  Work with your health care  provider or dietitian to adjust your eating plan to your individual calorie needs. This information is not intended to replace advice given to you by your health care provider. Make sure you discuss any questions you have with your health care provider. Document Revised: 11/26/2018 Document Reviewed: 11/26/2018 Elsevier Patient Education  2021 [place].   Cholesterol Content in Foods Cholesterol is a waxy, fat-like substance that helps to carry fat in the blood. The body needs cholesterol in small amounts, but too much cholesterol can cause damage to the arteries and heart. Most people should eat less than 200 milligrams (mg) of cholesterol a day. Foods with cholesterol Cholesterol is found in animal-based foods, such as meat, seafood, and dairy. Generally, low-fat dairy and lean meats have less cholesterol than full-fat dairy and fatty meats. The milligrams of cholesterol per serving (mg per serving) of common cholesterol-containing foods are listed below. Meat and other proteins  Egg -- one large whole egg has 186 mg.  Veal shank -- 4 oz has 141 mg.  Lean ground [place] (93% lean) -- 4 oz has 118 mg.  Fat-trimmed lamb loin -- 4 oz has 106 mg.  Lean ground beef (90% lean) -- 4 oz has 100 mg.  Lobster -- 3.5 oz has 90 mg.  Pork loin chops -- 4 oz has 86 mg.  Canned salmon -- 3.5 oz has 83 mg.  Fat-trimmed beef top loin -- 4 oz has 78 mg.  Frankfurter -- 1 frank (3.5 oz) has 77 mg.  Crab -- 3.5 oz has 71 mg.  Roasted chicken without skin, white meat -- 4 oz has 66 mg.  Light bologna -- 2 oz has 45 mg.  Deli-cut [place] -- 2 oz has 31 mg.  Canned tuna -- 3.5 oz has 31 mg.  Berniece Salines -- 1 oz has 29 mg.  Oysters and mussels (raw) -- 3.5 oz has 25 mg.  Mackerel -- 1 oz has 22 mg.  Trout -- 1 oz has 20 mg.  Pork sausage -- 1 link (1 oz) has 17 mg.  Salmon -- 1 oz has 16 mg.  Tilapia -- 1 oz has 14 mg. Dairy  Soft-serve ice cream --  cup (4 oz) has 103  mg.  Whole-milk yogurt -- 1 cup (8 oz) has 29 mg.  Cheddar cheese -- 1 oz has 28 mg.  American cheese -- 1 oz has 28 mg.  Whole milk -- 1 cup (8 oz) has 23 mg.  2% milk -- 1 cup (8 oz) has 18 mg.  Cream cheese -- 1 tablespoon (Tbsp) has 15 mg.  Cottage cheese --  cup (4 oz) has 14 mg.  Low-fat (1%) milk -- 1 cup (8 oz) has 10 mg.  Sour cream -- 1 Tbsp has 8.5 mg.  Low-fat yogurt -- 1 cup (8 oz) has 8 mg.  Nonfat Greek yogurt -- 1 cup (8 oz) has 7 mg.  Half-and-half cream -- 1 Tbsp has 5 mg. Fats and oils  Cod liver oil -- 1 tablespoon (Tbsp)  has 82 mg.  Butter -- 1 Tbsp has 15 mg.  Lard -- 1 Tbsp has 14 mg.  Bacon grease -- 1 Tbsp has 14 mg.  Mayonnaise -- 1 Tbsp has 5-10 mg.  Margarine -- 1 Tbsp has 3-10 mg. Exact amounts of cholesterol in these foods may vary depending on specific ingredients and brands.   Foods without cholesterol Most plant-based foods do not have cholesterol unless you combine them with a food that has cholesterol. Foods without cholesterol include:  Grains and cereals.  Vegetables.  Fruits.  Vegetable oils, such as olive, canola, and sunflower oil.  Legumes, such as peas, beans, and lentils.  Nuts and seeds.  Egg whites.   Summary  The body needs cholesterol in small amounts, but too much cholesterol can cause damage to the arteries and heart.  Most people should eat less than 200 milligrams (mg) of cholesterol a day. This information is not intended to replace advice given to you by your health care provider. Make sure you discuss any questions you have with your health care provider. Document Revised: 05/16/2019 Document Reviewed: 05/16/2019 Elsevier Patient Education  [place].

## 2020-02-09 NOTE — Progress Notes (Signed)
Name: Charles Valencia   MRN: [id number]    DOB: [birth year]   Date:02/09/2020       Progress Note  Chief Complaint  Patient presents with  . Diabetes  . Hypertension  . Hyperlipidemia     Subjective:   Charles Valencia is a 87 y.o. male, presents to clinic for routine f/up  DM - diet/lifestyle controlled - due for eye exam - hasn't been in a few years Lab Results  Component Value Date   HGBA1C 7.0 (A) 11/01/2019  on statin, arb, ASA and utd on foot exam Pt trying to work on [place] - eats a [place] a lot - had a lot of questions about diet/nutrition  Hypertension:  Currently managed on valsartan HCTZ160-12.5 - BP a little high today, hx of afib, bradycardia - sees cardiology at [facility] reports good med compliance and denies any SE.   Blood pressure today is elevated - was repeated prior to pt leaving and was improved/adequately controlled. BP Readings from Last 3 Encounters:  02/09/20 136/84  01/17/20 (!) 166/85  11/01/19 116/80   Pt denies CP, SOB, exertional sx, LE edema, palpitation, Ha's, visual disturbances, lightheadedness, hypotension, syncope. Dietary efforts for BP? none Hx of afib, on oral anticoagulants with hx of coagulopathy, PCP for years have been managing eliquis 2.5 mg BID Pt denies any bleeding, bruising, hematuria, melena  CKD stage 3 Renal function recent labs: Worsened in the past year - monitoring Lab Results  Component Value Date   GFRAA 54 (L) 08/24/2019   GFRAA 49 (L) 07/26/2019   GFRAA 84 10/27/2017    Lab Results  Component Value Date   CREATININE 1.37 (H) 08/24/2019   BUN 22 08/24/2019   NA 140 08/24/2019   K 3.9 08/24/2019   CL 104 08/24/2019   CO2 26 08/24/2019   Hyperlipidemia: Currently treated with crestor 5 mg, pt reports good med compliance Last Lipids: Lab Results  Component Value Date   CHOL 122 07/26/2019   HDL 46 07/26/2019   LDLCALC 57 07/26/2019   TRIG 100 07/26/2019   CHOLHDL 2.7 07/26/2019   -  Denies: Chest pain, shortness of breath, myalgias, claudication  Last Rx for statin was in 2020 - compliance/refills unclear - refills and recheck labs today Appears to be taken over by cardiology/[facility] - he has with him good compliance, working on diet changes  flomax managed by urology, last PSA done recently reviewed  GERD - on nexium - meds expired/ran out last year - pt states he still needs and is taking daily - takes daily in am, no abd pain, reflux, indigestion   Current Outpatient Medications:  .  aspirin 325 MG tablet, Take 81 mg by mouth daily. , Disp: , Rfl:  .  tamsulosin (FLOMAX) 0.4 MG CAPS capsule, TAKE 1 CAPSULE BY MOUTH EVERY DAY, Disp: 30 capsule, Rfl: 11 .  apixaban (ELIQUIS) 2.5 MG TABS tablet, Take 1 tablet (2.5 mg total) by mouth 2 (two) times daily., Disp: 180 tablet, Rfl: 1 .  esomeprazole (NEXIUM) 40 MG capsule, Take 1 capsule (40 mg total) by mouth daily as needed (acid reflux and stomach protection). TAKE 1 CAPSULE BY MOUTH DAILY AT 12 NOON, Disp: 90 capsule, Rfl: 1 .  rosuvastatin (CRESTOR) 5 MG tablet, Take 1 tablet (5 mg total) by mouth daily., Disp: 90 tablet, Rfl: 3 .  valsartan-hydrochlorothiazide (DIOVAN-HCT) 160-12.5 MG tablet, Take 1 tablet by mouth daily., Disp: 90 tablet, Rfl: 1  Patient Active Problem List  Diagnosis Date Noted  . Current use of long term anticoagulation 02/09/2020  . Stage 3a chronic kidney disease ([place]) 08/24/2019  . Coagulation disorder ([place]) 06/17/2018  . Type 2 diabetes mellitus with microalbuminuria, without long-term current use of insulin ([place]) 04/27/2017  . Prostate cancer ([place]) 09/12/2016  . GERD (gastroesophageal reflux disease) 09/13/2015  . Hyperlipidemia 09/13/2015  . Atrial fibrillation ([place]) 10/17/2014  . Bradycardia 06/14/2014  . Essential hypertension 06/14/2014  . Sick sinus syndrome ([place]) 06/14/2014    No past surgical history on file.  Family History  Problem Relation Age of Onset  . Diabetes Sister    . Hyperlipidemia Sister   . Hypertension Sister   . Kidney disease Sister   . Alzheimer's disease Sister   . Bladder Cancer Neg Hx   . Kidney cancer Neg Hx   . AAA (abdominal aortic aneurysm) Neg Hx     Social History   Tobacco Use  . Smoking status: Former Smoker    Types: Cigarettes  . Smokeless tobacco: Never Used  . Tobacco comment: smoking cessation materials not required; poor historian, unable to recall smoking history  Vaping Use  . Vaping Use: Never used  Substance Use Topics  . Alcohol use: No    Alcohol/week: 0.0 standard drinks  . Drug use: No     No Known Allergies  Health Maintenance  Topic Date Due  . OPHTHALMOLOGY EXAM  06/19/2018  . COVID-19 Vaccine (3 - Pfizer risk 4-dose series) 04/19/2019  . HEMOGLOBIN A1C  05/01/2020  . FOOT EXAM  11/06/2020  . TETANUS/TDAP  12/15/2021  . INFLUENZA VACCINE  Completed  . PNA vac Low Risk Adult  Completed    Chart Review Today: I personally reviewed active problem list, medication list, allergies, family history, social history, health maintenance, notes from last encounter, lab results, imaging with the patient/caregiver today.   Review of Systems  Constitutional: Negative.  Negative for activity change, appetite change, fatigue and unexpected weight change.  HENT: Negative.   Eyes: Negative.   Respiratory: Negative.  Negative for shortness of breath.   Cardiovascular: Negative.  Negative for chest pain, palpitations and leg swelling.  Gastrointestinal: Negative.  Negative for abdominal pain and blood in stool.  Endocrine: Negative.   Genitourinary: Negative.  Negative for decreased urine volume, difficulty urinating, testicular pain and urgency.  Skin: Negative.  Negative for color change and pallor.  Allergic/Immunologic: Negative.   Neurological: Negative.  Negative for syncope, weakness, light-headedness and numbness.  Psychiatric/Behavioral: Negative.  Negative for confusion, dysphoric mood, self-injury  and suicidal ideas. The patient is not nervous/anxious.   All other systems reviewed and are negative.    Objective:   Vitals:   02/09/20 1016 02/09/20 1109  BP: (!) 148/82 136/84  Pulse: 73   Resp: 18   Temp: 97.8 F (36.6 C)   TempSrc: Oral   SpO2: 98%   Weight: 220 lb 9.6 oz (100.1 kg)   Height: 6' (1.829 m)     Body mass index is 29.92 kg/m.  Physical Exam Vitals and nursing note reviewed.  Constitutional:      General: He is not in acute distress.    Appearance: Normal appearance. He is well-developed. He is not ill-appearing, toxic-appearing or diaphoretic.     Interventions: Face mask in place.     Comments: Elderly, obese, pleasant, appears stated age  HENT:     Head: Normocephalic and atraumatic.     Jaw: No trismus.     Right Ear: External ear  normal.     Left Ear: External ear normal.  Eyes:     General: Lids are normal. No scleral icterus.       Right eye: No discharge.        Left eye: No discharge.     Conjunctiva/sclera: Conjunctivae normal.  Neck:     Trachea: Trachea and phonation normal. No tracheal deviation.  Cardiovascular:     Rate and Rhythm: Normal rate. Rhythm irregularly irregular.     Pulses: Normal pulses.          Radial pulses are 2+ on the right side and 2+ on the left side.     Heart sounds: Normal heart sounds. No murmur heard. No friction rub. No gallop.   Pulmonary:     Effort: Pulmonary effort is normal. No respiratory distress.     Breath sounds: Normal breath sounds. No stridor. No wheezing, rhonchi or rales.  Abdominal:     General: Bowel sounds are normal. There is no distension.     Palpations: Abdomen is soft.  Musculoskeletal:     Right lower leg: No edema.     Left lower leg: No edema.  Skin:    General: Skin is warm and dry.     Coloration: Skin is not jaundiced or pale.     Findings: No bruising or rash.     Nails: There is no clubbing.  Neurological:     Mental Status: He is alert. Mental status is at  baseline.     Cranial Nerves: No dysarthria or facial asymmetry.     Motor: No tremor or abnormal muscle tone.     Gait: Gait normal.  Psychiatric:        Mood and Affect: Mood normal.        Speech: Speech normal.        Behavior: Behavior normal. Behavior is cooperative.         Assessment & Plan:   1. Essential hypertension BP fairly well controlled today, encouraged healthy diet choices, continue meds and cardiology f/up Not having any concerning cardiac sx - COMPLETE METABOLIC PANEL WITH GFR - valsartan-hydrochlorothiazide (DIOVAN-HCT) 160-12.5 MG tablet; Take 1 tablet by mouth daily.  Dispense: 90 tablet; Refill: 1  2. Mixed hyperlipidemia Compliant with meds, no SE, no myalgias, fatigue or jaundice Diet and exercise recommendations for HLD reviewed and handout given - COMPLETE METABOLIC PANEL WITH GFR - rosuvastatin (CRESTOR) 5 MG tablet; Take 1 tablet (5 mg total) by mouth daily.  Dispense: 90 tablet; Refill: 3  3. Type 2 diabetes mellitus with microalbuminuria, without long-term current use of insulin (HCC) Lab Results  Component Value Date   HGBA1C 7.0 (A) 11/01/2019  due for eye exam, utd on foot exam, on statin, arb, asa No sx concerning for hyperglycemia - recheck labs Reviewed healthy diet May be a good candidate for DM education/dietician referral with many questions about food/diet - COMPLETE METABOLIC PANEL WITH GFR - Hemoglobin A1c - Ambulatory referral to Ophthalmology  4. Gastroesophageal reflux disease, unspecified whether esophagitis present Take nexium daily, no GI sx, no hx of GI bleed Refills sent in  5. Paroxysmal atrial fibrillation (HCC) HR irregular today, on long term oral anticoagulant, no hx of stroke per pt He denies any palpitations near syncope orthopnea lower extremity edema, chest pain or exertional symptoms He does follow with cardiology at [facility] clinic - apixaban (ELIQUIS) 2.5 MG TABS tablet; Take 1 tablet (2.5 mg total) by  mouth 2 (two) times daily.  Dispense: 180 tablet; Refill: 1  6. Stage 3a chronic kidney disease ([place]) He has had a decline in his renal function with labs in 2021 compared to last labs which were in 2019 -he is on valsartan, do not see that he has ever been referred to nephrology  We will monitor renal function Encouraged him to avoid NSAIDs, keep hydrated, Dash diet and keeping blood pressure well controlled  Renal function recent labs:  Lab Results  Component Value Date   GFRAA 54 (L) 08/24/2019   GFRAA 49 (L) 07/26/2019   GFRAA 84 10/27/2017    Lab Results  Component Value Date   CREATININE 1.37 (H) 08/24/2019   BUN 22 08/24/2019   NA 140 08/24/2019   K 3.9 08/24/2019   CL 104 08/24/2019   CO2 26 08/24/2019   - COMPLETE METABOLIC PANEL WITH GFR  7. Coagulation disorder ([place]) Patient could not provide history today about what type of coagulation disorder he has but he is on aspirin and Eliquis daily he denies seeing a hematologist, he denies any spontaneous bleeding or bruising we will continue to monitor his H/H and renal function - CBC with Differential/Platelet - COMPLETE METABOLIC PANEL WITH GFR - apixaban (ELIQUIS) 2.5 MG TABS tablet; Take 1 tablet (2.5 mg total) by mouth 2 (two) times daily.  Dispense: 180 tablet; Refill: 1  8. Hyperlipidemia, unspecified hyperlipidemia type See above  9. Current use of long term anticoagulation See #7 and #9 No symptoms or signs concerning for bleeding today Monitoring labs - CBC with Differential/Platelet - COMPLETE METABOLIC PANEL WITH GFR - apixaban (ELIQUIS) 2.5 MG TABS tablet; Take 1 tablet (2.5 mg total) by mouth 2 (two) times daily.  Dispense: 180 tablet; Refill: 1  10. Gastroesophageal reflux disease Symptoms well controlled on long-term PPI - esomeprazole (NEXIUM) 40 MG capsule; Take 1 capsule (40 mg total) by mouth daily as needed (acid reflux and stomach protection). TAKE 1 CAPSULE BY MOUTH DAILY AT 12 NOON   Dispense: 90 capsule; Refill: 1    Return in about 6 months (around 08/08/2020) for Routine follow-up.   Delsa Grana, PA-C 02/09/20 11:17 AM

## 2020-02-10 LAB — CBC WITH DIFFERENTIAL/PLATELET
Absolute Monocytes: 338 cells/uL (ref 200–950)
Basophils Absolute: 39 cells/uL (ref 0–200)
Basophils Relative: 0.8 %
Eosinophils Absolute: 211 cells/uL (ref 15–500)
Eosinophils Relative: 4.3 %
HCT: 38.5 % (ref 38.5–50.0)
Hemoglobin: 12.8 g/dL — ABNORMAL LOW (ref 13.2–17.1)
Lymphs Abs: 828 cells/uL — ABNORMAL LOW (ref 850–3900)
MCH: 28.8 pg (ref 27.0–33.0)
MCHC: 33.2 g/dL (ref 32.0–36.0)
MCV: 86.5 fL (ref 80.0–100.0)
MPV: 11.6 fL (ref 7.5–12.5)
Monocytes Relative: 6.9 %
Neutro Abs: 3484 cells/uL (ref 1500–7800)
Neutrophils Relative %: 71.1 %
Platelets: 196 10*3/uL (ref 140–400)
RBC: 4.45 10*6/uL (ref 4.20–5.80)
RDW: 13.6 % (ref 11.0–15.0)
Total Lymphocyte: 16.9 %
WBC: 4.9 10*3/uL (ref 3.8–10.8)

## 2020-02-10 LAB — COMPLETE METABOLIC PANEL WITH GFR
AG Ratio: 1.4 (calc) (ref 1.0–2.5)
ALT: 18 U/L (ref 9–46)
AST: 23 U/L (ref 10–35)
Albumin: 4 g/dL (ref 3.6–5.1)
Alkaline phosphatase (APISO): 92 U/L (ref 35–144)
BUN: 15 mg/dL (ref 7–25)
CO2: 28 mmol/L (ref 20–32)
Calcium: 9.5 mg/dL (ref 8.6–10.3)
Chloride: 103 mmol/L (ref 98–110)
Creat: 1.09 mg/dL (ref 0.70–1.11)
GFR, Est African American: 70 mL/min/{1.73_m2} (ref >=60)
GFR, Est Non African American: 61 mL/min/{1.73_m2} (ref >=60)
Globulin: 2.8 g/dL (calc) (ref 1.9–3.7)
Glucose, Bld: 99 mg/dL (ref 65–99)
Potassium: 3.6 mmol/L (ref 3.5–5.3)
Sodium: 142 mmol/L (ref 135–146)
Total Bilirubin: 0.7 mg/dL (ref 0.2–1.2)
Total Protein: 6.8 g/dL (ref 6.1–8.1)

## 2020-02-10 LAB — HEMOGLOBIN A1C
Hgb A1c MFr Bld: 7.3 % of total Hgb — ABNORMAL HIGH (ref <5.7)
Mean Plasma Glucose: 163 mg/dL
eAG (mmol/L): 9 mmol/L

## 2020-02-21 ENCOUNTER — Ambulatory Visit (INDEPENDENT_AMBULATORY_CARE_PROVIDER_SITE_OTHER): Payer: Medicare Other | Admitting: Emergency Medicine

## 2020-02-21 DIAGNOSIS — D689 Coagulation defect, unspecified: Secondary | ICD-10-CM

## 2020-02-21 LAB — HEMOCCULT GUIAC POC 1CARD (OFFICE)
Card #2 Fecal Occult Blod, POC: NEGATIVE
Card #3 Fecal Occult Blood, POC: NEGATIVE
Fecal Occult Blood, POC: NEGATIVE

## 2020-03-08 ENCOUNTER — Ambulatory Visit (INDEPENDENT_AMBULATORY_CARE_PROVIDER_SITE_OTHER): Payer: Medicare Other | Admitting: Podiatry

## 2020-03-08 ENCOUNTER — Other Ambulatory Visit: Payer: Self-pay

## 2020-03-08 ENCOUNTER — Encounter: Payer: Self-pay | Admitting: Podiatry

## 2020-03-08 DIAGNOSIS — B351 Tinea unguium: Secondary | ICD-10-CM | POA: Diagnosis not present

## 2020-03-08 DIAGNOSIS — M79674 Pain in right toe(s): Secondary | ICD-10-CM | POA: Diagnosis not present

## 2020-03-08 DIAGNOSIS — D689 Coagulation defect, unspecified: Secondary | ICD-10-CM

## 2020-03-08 DIAGNOSIS — E119 Type 2 diabetes mellitus without complications: Secondary | ICD-10-CM | POA: Diagnosis not present

## 2020-03-08 DIAGNOSIS — M79675 Pain in left toe(s): Secondary | ICD-10-CM

## 2020-03-08 NOTE — Progress Notes (Signed)
This patient returns to my office for at risk foot care.  This patient requires this care by a professional since this patient will be at risk due to having diabetes,chronic kidney disease and coagulation defect.  Patient is taking eliquiss.  This patient is unable to cut nails himself since the patient cannot reach his nails.These nails are painful walking and wearing shoes.  This patient presents for at risk foot care today.  General Appearance  Alert, conversant and in no acute stress.  Vascular  Dorsalis pedis and posterior tibial  pulses are palpable  bilaterally.  Capillary return is within normal limits  bilaterally. Temperature is within normal limits  bilaterally.  Neurologic  Senn-Weinstein monofilament wire test within normal limits  bilaterally. Muscle power within normal limits bilaterally.  Nails Thick disfigured discolored nails with subungual debris  from hallux to fifth toes bilaterally. No evidence of bacterial infection or drainage bilaterally.  Orthopedic  No limitations of motion  feet .  No crepitus or effusions noted.  No bony pathology or digital deformities noted.  Skin  normotropic skin with no porokeratosis noted bilaterally.  No signs of infections or ulcers noted.     Onychomycosis  Pain in right toes  Pain in left toes  Consent was obtained for treatment procedures.   Mechanical debridement of nails 1-5  bilaterally performed with a nail nipper.  Filed with dremel without incident.    Return office visit     3 months                Told patient to return for periodic foot care and evaluation due to potential at risk complications.   Gardiner Barefoot DPM

## 2020-04-26 DIAGNOSIS — E119 Type 2 diabetes mellitus without complications: Secondary | ICD-10-CM | POA: Diagnosis not present

## 2020-04-26 DIAGNOSIS — H40053 Ocular hypertension, bilateral: Secondary | ICD-10-CM | POA: Diagnosis not present

## 2020-04-26 LAB — HM DIABETES EYE EXAM

## 2020-05-11 DIAGNOSIS — H40053 Ocular hypertension, bilateral: Secondary | ICD-10-CM | POA: Diagnosis not present

## 2020-05-28 DIAGNOSIS — R001 Bradycardia, unspecified: Secondary | ICD-10-CM | POA: Diagnosis not present

## 2020-05-28 DIAGNOSIS — I482 Chronic atrial fibrillation, unspecified: Secondary | ICD-10-CM | POA: Diagnosis not present

## 2020-05-28 DIAGNOSIS — E782 Mixed hyperlipidemia: Secondary | ICD-10-CM | POA: Diagnosis not present

## 2020-05-28 DIAGNOSIS — I10 Essential (primary) hypertension: Secondary | ICD-10-CM | POA: Diagnosis not present

## 2020-05-28 DIAGNOSIS — E119 Type 2 diabetes mellitus without complications: Secondary | ICD-10-CM | POA: Diagnosis not present

## 2020-05-28 DIAGNOSIS — K219 Gastro-esophageal reflux disease without esophagitis: Secondary | ICD-10-CM | POA: Diagnosis not present

## 2020-05-28 DIAGNOSIS — Z8679 Personal history of other diseases of the circulatory system: Secondary | ICD-10-CM | POA: Diagnosis not present

## 2020-05-28 DIAGNOSIS — R0602 Shortness of breath: Secondary | ICD-10-CM | POA: Diagnosis not present

## 2020-06-05 ENCOUNTER — Ambulatory Visit (INDEPENDENT_AMBULATORY_CARE_PROVIDER_SITE_OTHER): Payer: Medicare Other

## 2020-06-05 DIAGNOSIS — Z0000 Encounter for general adult medical examination without abnormal findings: Secondary | ICD-10-CM

## 2020-06-05 NOTE — Progress Notes (Signed)
Subjective:   Charles Valencia is a 87 y.o. male who presents for Medicare Annual/Subsequent preventive examination.  Virtual Visit via Telephone Note  I connected with  Charles Valencia on 06/05/20 at 10:00 AM EDT by telephone and verified that I am speaking with the correct person using two identifiers.  Location: Patient: home Provider: [place] Persons participating in the virtual visit: patient & niece Charles Valencia/Nurse Health Advisor   I discussed the limitations, risks, security and privacy concerns of performing an evaluation and management service by telephone and the availability of in person appointments. The patient expressed understanding and agreed to proceed.  Interactive audio and video telecommunications were attempted between this nurse and patient, however failed, due to patient having technical difficulties OR patient did not have access to video capability.  We continued and completed visit with audio only.  Some vital signs may be absent or patient reported.   Clemetine Marker, LPN    Review of Systems     Cardiac Risk Factors include: advanced age (>71men, >75 women);male gender;dyslipidemia;diabetes mellitus;hypertension     Objective:    There were no vitals filed for this visit. There is no height or weight on file to calculate BMI.  Advanced Directives 06/01/2018 05/28/2017 09/29/2016 09/16/2016 09/10/2016 07/02/2016 05/01/2016  Does Patient Have a Medical Advance Directive? No No No No No No No  Would patient like information on creating a medical advance directive? No - Patient declined Yes (MAU/Ambulatory/Procedural Areas - Information given) - - - - -    Current Medications (verified) Outpatient Encounter Medications as of 06/05/2020  Medication Sig  . apixaban (ELIQUIS) 2.5 MG TABS tablet Take 1 tablet (2.5 mg total) by mouth 2 (two) times daily.  Marland Kitchen aspirin EC 81 MG tablet Take 81 mg by mouth daily. Swallow whole.  . esomeprazole (NEXIUM) 40 MG capsule Take 1  capsule (40 mg total) by mouth daily as needed (acid reflux and stomach protection). TAKE 1 CAPSULE BY MOUTH DAILY AT 12 NOON  . LUMIGAN 0.01 % SOLN 1 drop at bedtime.  . rosuvastatin (CRESTOR) 5 MG tablet Take 1 tablet (5 mg total) by mouth daily.  . tamsulosin (FLOMAX) 0.4 MG CAPS capsule TAKE 1 CAPSULE BY MOUTH EVERY DAY  . valsartan-hydrochlorothiazide (DIOVAN-HCT) 160-12.5 MG tablet Take 1 tablet by mouth daily.  . [DISCONTINUED] aspirin 325 MG tablet Take 81 mg by mouth daily.    No facility-administered encounter medications on file as of 06/05/2020.    Allergies (verified) Patient has no known allergies.   History: Past Medical History:  Diagnosis Date  . Diabetes ([place])   . GERD (gastroesophageal reflux disease)   . Glaucoma   . Hyperlipidemia   . Hypertension   . Paroxysmal atrial fibrillation ([place])    Followed by Cardiology.   History reviewed. No pertinent surgical history. Family History  Problem Relation Age of Onset  . Diabetes Sister   . Hyperlipidemia Sister   . Hypertension Sister   . Kidney disease Sister   . Alzheimer's disease Sister   . Bladder Cancer Neg Hx   . Kidney cancer Neg Hx   . AAA (abdominal aortic aneurysm) Neg Hx    Social History   Socioeconomic History  . Marital status: Divorced    Spouse name: Not on file  . Number of children: 0  . Years of education: Not on file  . Highest education level: 4th grade  Occupational History  . Occupation: Retired  Tobacco Use  . Smoking status: Former Smoker  Types: Cigarettes  . Smokeless tobacco: Never Used  . Tobacco comment: smoking cessation materials not required; poor historian, unable to recall smoking history  Vaping Use  . Vaping Use: Never used  Substance and Sexual Activity  . Alcohol use: No    Alcohol/week: 0.0 standard drinks  . Drug use: No  . Sexual activity: Not Currently  Other Topics Concern  . Not on file  Social History Narrative   Pt lives alone, does not drive.     Social Determinants of Health   Financial Resource Strain: Low Risk   . Difficulty of Paying Living Expenses: Not hard at all  Food Insecurity: No Food Insecurity  . Worried About Charity fundraiser in the Last Year: Never true  . Ran Out of Food in the Last Year: Never true  Transportation Needs: No Transportation Needs  . Lack of Transportation (Medical): No  . Lack of Transportation (Non-Medical): No  Physical Activity: Sufficiently Active  . Days of Exercise per Week: 7 days  . Minutes of Exercise per Session: 30 min  Stress: No Stress Concern Present  . Feeling of Stress : Not at all  Social Connections: Moderately Isolated  . Frequency of Communication with Friends and Family: More than three times a week  . Frequency of Social Gatherings with Friends and Family: Twice a week  . Attends Religious Services: More than 4 times per year  . Active Member of Clubs or Organizations: No  . Attends Archivist Meetings: Never  . Marital Status: Divorced    Tobacco Counseling Counseling given: Not Answered Comment: smoking cessation materials not required; poor historian, unable to recall smoking history   Clinical Intake:  Pre-visit preparation completed: Yes  Pain : No/denies pain     Nutritional Risks: None Diabetes: Yes CBG done?: No Did pt. bring in CBG monitor from home?: No  How often do you need to have someone help you when you read instructions, pamphlets, or other written materials from your doctor or pharmacy?: 1 - Never  Nutrition Risk Assessment:  Has the patient had any N/V/D within the last 2 months?  No  Does the patient have any non-healing wounds?  No  Has the patient had any unintentional weight loss or weight gain?  No   Diabetes:  Is the patient diabetic?  Yes  If diabetic, was a CBG obtained today?  No  Did the patient bring in their glucometer from home?  No  How often do you monitor your CBG's? Pt does not actively check blood  sugar.   Financial Strains and Diabetes Management:  Are you having any financial strains with the device, your supplies or your medication? No .  Does the patient want to be seen by Chronic Care Management for management of their diabetes?  No  Would the patient like to be referred to a Nutritionist or for Diabetic Management?  No   Diabetic Exams:  Diabetic Eye Exam: Completed 04/26/20 negative retinopathy.  Diabetic Foot Exam: Completed 11/07/19.    Interpreter Needed?: No  Information entered by :: Clemetine Marker LPN   Activities of Daily Living In your present state of health, do you have any difficulty performing the following activities: 06/05/2020 02/09/2020  Hearing? Y Y  Comment declines hearing aids -  Vision? N N  Difficulty concentrating or making decisions? N N  Walking or climbing stairs? N N  Dressing or bathing? N N  Doing errands, shopping? N Y  Conservation officer, nature and  eating ? N -  Using the Toilet? N -  In the past six months, have you accidently leaked urine? N -  Do you have problems with loss of bowel control? N -  Managing your Medications? N -  Managing your Finances? N -  Housekeeping or managing your Housekeeping? N -  Some recent data might be hidden    Patient Care Team: Delsa Grana, PA-C as PCP - General (Family Medicine) Yolonda Kida, MD as Consulting Physician (Cardiology) [name], MD as Consulting Physician (Urology) Germaine Pomfret, [facility] (Pharmacist) Gardiner Barefoot, DPM as Consulting Physician (Podiatry)  Indicate any recent Medical Services you may have received from other than [facility] providers in the past year (date may be approximate).     Assessment:   This is a routine wellness examination for [facility].  Hearing/Vision screen  Hearing Screening   125Hz  250Hz  500Hz  1000Hz  2000Hz  3000Hz  4000Hz  6000Hz  8000Hz   Right ear:           Left ear:           Comments: Pt c/o mild hearing difficulty, declines hearing aids   Vision  Screening Comments: [facility]  Dietary issues and exercise activities discussed: Current Exercise Habits: Home exercise routine, Type of exercise: walking, Time (Minutes): 30, Frequency (Times/Week): 7, Weekly Exercise (Minutes/Week): 210, Intensity: Mild, Exercise limited by: None identified  Goals Addressed            This Visit's Progress   . DIET - INCREASE WATER INTAKE   On track    Recommend to drink at least 6-8 8oz glasses of water per day.      Depression Screen PHQ 2/9 Scores 06/05/2020 02/09/2020 11/01/2019 08/30/2019 08/24/2019 07/26/2019 06/02/2019  PHQ - 2 Score 0 0 0 0 0 0 0  PHQ- 9 Score - - - - 1 0 -    Fall Risk Fall Risk  06/05/2020 02/09/2020 11/01/2019 08/30/2019 08/24/2019  Falls in the past year? 0 0 0 0 0  Number falls in past yr: 0 0 0 0 0  Injury with Fall? 0 0 0 0 0  Comment - - - - -  Risk for fall due to : No Fall Risks - - - -  Risk for fall due to: Comment - - - - -  Follow up Falls prevention discussed Falls evaluation completed - - -    FALL RISK PREVENTION PERTAINING TO THE HOME:  Any stairs in or around the home? No  If so, are there any without handrails? No  Home free of loose throw rugs in walkways, pet beds, electrical cords, etc? Yes  Adequate lighting in your home to reduce risk of falls? Yes   ASSISTIVE DEVICES UTILIZED TO PREVENT FALLS:  Life alert? No  Use of a cane, walker or w/c? No  Grab bars in the bathroom? Yes  Shower chair or bench in shower? Yes  Elevated toilet seat or a handicapped toilet? Yes   TIMED UP AND GO:  Was the test performed? No . Telephonic visit  Cognitive Function: Normal cognitive status assessed by direct observation by this Nurse Health Advisor. No abnormalities found. Pt did have hearing difficulty during call but no cognitive concerns per niece Joycelyn Schmid.      6CIT Screen 06/02/2019 06/01/2018 05/28/2017  What Year? 0 points 0 points 0 points  What month? 0 points 0  points 3 points  What time? 0 points 0 points 3 points  Count  back from 20 0 points 2 points 4 points  Months in reverse 4 points 4 points 4 points  Repeat phrase 10 points 10 points 10 points  Total Score 14 16 24     Immunizations Immunization History  Administered Date(s) Administered  . Fluad Quad(high Dose 65+) 10/13/2018, 11/01/2019  . Influenza, High Dose Seasonal PF 10/20/2014, 09/13/2015, 09/10/2016, 10/27/2017  . PFIZER(Purple Top)SARS-COV-2 Vaccination 02/21/2019, 03/22/2019  . Pneumococcal Conjugate-13 03/30/2013  . Pneumococcal Polysaccharide-23 09/10/2016    TDAP status: Up to date  Flu Vaccine status: Up to date  Pneumococcal vaccine status: Up to date  Covid-19 vaccine status: Completed vaccines  Qualifies for Shingles Vaccine? Yes   Zostavax completed No   Shingrix Completed?: No.    Education has been provided regarding the importance of this vaccine. Patient has been advised to call insurance company to determine out of pocket expense if they have not yet received this vaccine. Advised may also receive vaccine at local pharmacy or Health Dept. Verbalized acceptance and understanding.  Screening Tests Health Maintenance  Topic Date Due  . Zoster Vaccines- Shingrix (1 of 2) Never done  . COVID-19 Vaccine (3 - Pfizer risk 4-dose series) 04/19/2019  . INFLUENZA VACCINE  08/06/2020  . HEMOGLOBIN A1C  08/08/2020  . FOOT EXAM  11/06/2020  . OPHTHALMOLOGY EXAM  04/26/2021  . TETANUS/TDAP  12/15/2021  . PNA vac Low Risk Adult  Completed  . HPV VACCINES  Aged Out    Health Maintenance  Health Maintenance Due  Topic Date Due  . Zoster Vaccines- Shingrix (1 of 2) Never done  . COVID-19 Vaccine (3 - Pfizer risk 4-dose series) 04/19/2019    Colorectal cancer screening: No longer required.   Lung Cancer Screening: (Low Dose CT Chest recommended if Age 53-80 years, 30 pack-year currently smoking OR have quit w/in 15years.) does not qualify.    Additional  Screening:  Hepatitis C Screening: does not qualify.  Vision Screening: Recommended annual ophthalmology exams for early detection of glaucoma and other disorders of the eye. Is the patient up to date with their annual eye exam?  Yes  Who is the provider or what is the name of the office in which the patient attends annual eye exams? [facility].   Dental Screening: Recommended annual dental exams for proper oral hygiene  Community Resource Referral / Chronic Care Management: CRR required this visit?  No   CCM required this visit?  No      Plan:     I have personally reviewed and noted the following in the patient's chart:   . Medical and social history . Use of alcohol, tobacco or illicit drugs  . Current medications and supplements including opioid prescriptions. Patient is not currently taking opioid prescriptions. . Functional ability and status . Nutritional status . Physical activity . Advanced directives . List of other physicians . Hospitalizations, surgeries, and ER visits in previous 12 months . Vitals . Screenings to include cognitive, depression, and falls . Referrals and appointments  In addition, I have reviewed and discussed with patient certain preventive protocols, quality metrics, and best practice recommendations. A written personalized care plan for preventive services as well as general preventive health recommendations were provided to patient.     Clemetine Marker, LPN   [id number]   Nurse Notes: none

## 2020-06-05 NOTE — Patient Instructions (Signed)
Charles Valencia , Thank you for taking time to come for your Medicare Wellness Visit. I appreciate your ongoing commitment to your health goals. Please review the following plan we discussed and let me know if I can assist you in the future.   Screening recommendations/referrals: Colonoscopy: no longer required Recommended yearly ophthalmology/optometry visit for glaucoma screening and checkup Recommended yearly dental visit for hygiene and checkup  Vaccinations: Influenza vaccine: done 11/01/19 Pneumococcal vaccine: done 09/10/16 Tdap vaccine: done 12/16/11 Shingles vaccine: Shingrix discussed. Please contact your pharmacy for coverage information.  Covid-19: done 02/21/19 & 03/22/19; please bring a copy of vaccine card to next appt for booster information  Advanced directives: Advance directive discussed with you today. Even though you declined this today please call our office should you change your mind and we can give you the proper paperwork for you to fill out.  Conditions/risks identified: Keep up the great work!  Next appointment: Follow up in one year for your annual wellness visit.   Preventive Care 81 Years and Older, Male Preventive care refers to lifestyle choices and visits with your health care provider that can promote health and wellness. What does preventive care include?  A yearly physical exam. This is also called an annual well check.  Dental exams once or twice a year.  Routine eye exams. Ask your health care provider how often you should have your eyes checked.  Personal lifestyle choices, including:  Daily care of your teeth and gums.  Regular physical activity.  Eating a healthy diet.  Avoiding tobacco and drug use.  Limiting alcohol use.  Practicing safe sex.  Taking low doses of aspirin every day.  Taking vitamin and mineral supplements as recommended by your health care provider. What happens during an annual well check? The services and  screenings done by your health care provider during your annual well check will depend on your age, overall health, lifestyle risk factors, and family history of disease. Counseling  Your health care provider may ask you questions about your:  Alcohol use.  Tobacco use.  Drug use.  Emotional well-being.  Home and relationship well-being.  Sexual activity.  Eating habits.  History of falls.  Memory and ability to understand (cognition).  Work and work Statistician. Screening  You may have the following tests or measurements:  Height, weight, and BMI.  Blood pressure.  Lipid and cholesterol levels. These may be checked every 5 years, or more frequently if you are over 51 years old.  Skin check.  Lung cancer screening. You may have this screening every year starting at age 36 if you have a 30-pack-year history of smoking and currently smoke or have quit within the past 15 years.  Fecal occult blood test (FOBT) of the stool. You may have this test every year starting at age 42.  Flexible sigmoidoscopy or colonoscopy. You may have a sigmoidoscopy every 5 years or a colonoscopy every 10 years starting at age 41.  Prostate cancer screening. Recommendations will vary depending on your family history and other risks.  Hepatitis C blood test.  Hepatitis B blood test.  Sexually transmitted disease (STD) testing.  Diabetes screening. This is done by checking your blood sugar (glucose) after you have not eaten for a while (fasting). You may have this done every 1-3 years.  Abdominal aortic aneurysm (AAA) screening. You may need this if you are a current or former smoker.  Osteoporosis. You may be screened starting at age 25 if you are at high  risk. Talk with your health care provider about your test results, treatment options, and if necessary, the need for more tests. Vaccines  Your health care provider may recommend certain vaccines, such as:  Influenza vaccine. This is  recommended every year.  Tetanus, diphtheria, and acellular pertussis (Tdap, Td) vaccine. You may need a Td booster every 10 years.  Zoster vaccine. You may need this after age 56.  Pneumococcal 13-valent conjugate (PCV13) vaccine. One dose is recommended after age 5.  Pneumococcal polysaccharide (PPSV23) vaccine. One dose is recommended after age 59. Talk to your health care provider about which screenings and vaccines you need and how often you need them. This information is not intended to replace advice given to you by your health care provider. Make sure you discuss any questions you have with your health care provider. Document Released: 01/19/2015 Document Revised: 09/12/2015 Document Reviewed: 10/24/2014 Elsevier Interactive Patient Education  2017 [place] Prevention in the Home Falls can cause injuries. They can happen to people of all ages. There are many things you can do to make your home safe and to help prevent falls. What can I do on the outside of my home?  Regularly fix the edges of walkways and driveways and fix any cracks.  Remove anything that might make you trip as you walk through a door, such as a raised step or threshold.  Trim any bushes or trees on the path to your home.  Use bright outdoor lighting.  Clear any walking paths of anything that might make someone trip, such as rocks or tools.  Regularly check to see if handrails are loose or broken. Make sure that both sides of any steps have handrails.  Any raised decks and porches should have guardrails on the edges.  Have any leaves, snow, or ice cleared regularly.  Use sand or salt on walking paths during winter.  Clean up any spills in your garage right away. This includes oil or grease spills. What can I do in the bathroom?  Use night lights.  Install grab bars by the toilet and in the tub and shower. Do not use towel bars as grab bars.  Use non-skid mats or decals in the tub or  shower.  If you need to sit down in the shower, use a plastic, non-slip stool.  Keep the floor dry. Clean up any water that spills on the floor as soon as it happens.  Remove soap buildup in the tub or shower regularly.  Attach bath mats securely with double-sided non-slip rug tape.  Do not have throw rugs and other things on the floor that can make you trip. What can I do in the bedroom?  Use night lights.  Make sure that you have a light by your bed that is easy to reach.  Do not use any sheets or blankets that are too big for your bed. They should not hang down onto the floor.  Have a firm chair that has side arms. You can use this for support while you get dressed.  Do not have throw rugs and other things on the floor that can make you trip. What can I do in the kitchen?  Clean up any spills right away.  Avoid walking on wet floors.  Keep items that you use a lot in easy-to-reach places.  If you need to reach something above you, use a strong step stool that has a grab bar.  Keep electrical cords out of the way.  Do not use floor polish or wax that makes floors slippery. If you must use wax, use non-skid floor wax.  Do not have throw rugs and other things on the floor that can make you trip. What can I do with my stairs?  Do not leave any items on the stairs.  Make sure that there are handrails on both sides of the stairs and use them. Fix handrails that are broken or loose. Make sure that handrails are as long as the stairways.  Check any carpeting to make sure that it is firmly attached to the stairs. Fix any carpet that is loose or worn.  Avoid having throw rugs at the top or bottom of the stairs. If you do have throw rugs, attach them to the floor with carpet tape.  Make sure that you have a light switch at the top of the stairs and the bottom of the stairs. If you do not have them, ask someone to add them for you. What else can I do to help prevent  falls?  Wear shoes that:  Do not have high heels.  Have rubber bottoms.  Are comfortable and fit you well.  Are closed at the toe. Do not wear sandals.  If you use a stepladder:  Make sure that it is fully opened. Do not climb a closed stepladder.  Make sure that both sides of the stepladder are locked into place.  Ask someone to hold it for you, if possible.  Clearly mark and make sure that you can see:  Any grab bars or handrails.  First and last steps.  Where the edge of each step is.  Use tools that help you move around (mobility aids) if they are needed. These include:  Canes.  Walkers.  Scooters.  Crutches.  Turn on the lights when you go into a dark area. Replace any light bulbs as soon as they burn out.  Set up your furniture so you have a clear path. Avoid moving your furniture around.  If any of your floors are uneven, fix them.  If there are any pets around you, be aware of where they are.  Review your medicines with your doctor. Some medicines can make you feel dizzy. This can increase your chance of falling. Ask your doctor what other things that you can do to help prevent falls. This information is not intended to replace advice given to you by your health care provider. Make sure you discuss any questions you have with your health care provider. Document Released: 10/19/2008 Document Revised: 05/31/2015 Document Reviewed: 01/27/2014 Elsevier Interactive Patient Education  2017 [employer].

## 2020-06-14 ENCOUNTER — Ambulatory Visit: Payer: Medicare Other | Admitting: Podiatry

## 2020-06-20 DIAGNOSIS — S63501A Unspecified sprain of right wrist, initial encounter: Secondary | ICD-10-CM | POA: Diagnosis not present

## 2020-06-20 DIAGNOSIS — S66911A Strain of unspecified muscle, fascia and tendon at wrist and hand level, right hand, initial encounter: Secondary | ICD-10-CM | POA: Diagnosis not present

## 2020-06-20 DIAGNOSIS — M25531 Pain in right wrist: Secondary | ICD-10-CM | POA: Diagnosis not present

## 2020-06-20 DIAGNOSIS — W010XXA Fall on same level from slipping, tripping and stumbling without subsequent striking against object, initial encounter: Secondary | ICD-10-CM | POA: Diagnosis not present

## 2020-06-20 DIAGNOSIS — S01511A Laceration without foreign body of lip, initial encounter: Secondary | ICD-10-CM | POA: Diagnosis not present

## 2020-07-10 ENCOUNTER — Other Ambulatory Visit: Payer: Self-pay

## 2020-07-10 DIAGNOSIS — C61 Malignant neoplasm of prostate: Secondary | ICD-10-CM

## 2020-07-12 ENCOUNTER — Ambulatory Visit: Payer: Medicare Other | Admitting: Podiatry

## 2020-07-17 ENCOUNTER — Other Ambulatory Visit: Payer: Self-pay

## 2020-07-17 ENCOUNTER — Other Ambulatory Visit: Payer: Medicare Other

## 2020-07-17 DIAGNOSIS — C61 Malignant neoplasm of prostate: Secondary | ICD-10-CM

## 2020-07-18 LAB — PSA: Prostate Specific Ag, Serum: <0.1 ng/mL (ref 0.0–4.0)

## 2020-07-24 ENCOUNTER — Other Ambulatory Visit: Payer: Self-pay

## 2020-07-24 ENCOUNTER — Encounter: Payer: Self-pay | Admitting: Urology

## 2020-07-24 ENCOUNTER — Ambulatory Visit (INDEPENDENT_AMBULATORY_CARE_PROVIDER_SITE_OTHER): Payer: Medicare Other | Admitting: Urology

## 2020-07-24 VITALS — BP 121/63 | HR 55 | Ht 72.0 in | Wt 222.4 lb

## 2020-07-24 DIAGNOSIS — C61 Malignant neoplasm of prostate: Secondary | ICD-10-CM | POA: Diagnosis not present

## 2020-07-24 DIAGNOSIS — N400 Enlarged prostate without lower urinary tract symptoms: Secondary | ICD-10-CM

## 2020-07-24 MED ORDER — LEUPROLIDE ACETATE (6 MONTH) 45 MG ~~LOC~~ KIT
45.0000 mg | PACK | Freq: Once | SUBCUTANEOUS | Status: AC
  Administered 2020-07-24: 45 mg via SUBCUTANEOUS

## 2020-07-24 NOTE — Progress Notes (Signed)
07/24/2020 4:58 PM   Charles Valencia Jul 12, 1931 [id number]  Referring provider: Towanda Malkin, MD [place],  [place] [address]  No chief complaint on file.   HPI: 87 year old male with high risk clinically symptomatic prostate cancer on ADT alone who returns today for 6-month follow-up.  He initially presented in 10/2016 with elevated PSA to 16.9.  PSA was checked by his PCP on 09/10/2016 and markedly elevated to 16.9. Last known PSA in 2012 8.4.  Rectal exam did show an enlarged gland which was massively enlarged with induration on the right lateral ridge without a discrete nodule although the exam was somewhat limited to habitus.  PSA was repeated on 01/12/2017 arose to 35.7.   Patient was extremely hesitant to undergo prostate biopsy due to concern for the enema thus this was delayed until 04/15/2017.  Ultimately prostate biopsy on this day revealed high risk prostate cancer, Gleason 4+5 up to 100% of the tissue including all biopsies right side of the biopsy in 2 of the left.  TRUS vol 53.   He underwent staging with CT scan and bone scan 04/2017 both of which are negative for any evidence of metastatic disease.  Notably, there is borderline prominence of the seminal vesicles but otherwise no lymphadenopathy.   Given his age and comorbidities, he was started on ADT alone in the form of leuprolide Depo.    Most recent PSA on 07/17/2020 is undetectable.  He continues on Flomax in addition to his ADT.  He remains asymptomatic and has no urinary complaints.  PMH: Past Medical History:  Diagnosis Date   Diabetes ([place])    GERD (gastroesophageal reflux disease)    Glaucoma    Hyperlipidemia    Hypertension    Paroxysmal atrial fibrillation ([place])    Followed by Cardiology.    Surgical History: History reviewed. No pertinent surgical history.  Home Medications:  Allergies as of 07/24/2020   No Known Allergies      Medication List        Accurate as of  July 24, 2020 11:59 PM. If you have any questions, ask your nurse or doctor.          apixaban 2.5 MG Tabs tablet Commonly known as: Eliquis Take 1 tablet (2.5 mg total) by mouth 2 (two) times daily.   aspirin EC 81 MG tablet Take 81 mg by mouth daily. Swallow whole.   esomeprazole 40 MG capsule Commonly known as: NEXIUM Take 1 capsule (40 mg total) by mouth daily as needed (acid reflux and stomach protection). TAKE 1 CAPSULE BY MOUTH DAILY AT 12 NOON   Lumigan 0.01 % Soln Generic drug: bimatoprost 1 drop at bedtime.   rosuvastatin 5 MG tablet Commonly known as: CRESTOR Take 1 tablet (5 mg total) by mouth daily.   tamsulosin 0.4 MG Caps capsule Commonly known as: FLOMAX TAKE 1 CAPSULE BY MOUTH EVERY DAY   valsartan-hydrochlorothiazide 160-12.5 MG tablet Commonly known as: DIOVAN-HCT Take 1 tablet by mouth daily.        Allergies: No Known Allergies  Family History: Family History  Problem Relation Age of Onset   Diabetes Sister    Hyperlipidemia Sister    Hypertension Sister    Kidney disease Sister    Alzheimer's disease Sister    Bladder Cancer Neg Hx    Kidney cancer Neg Hx    AAA (abdominal aortic aneurysm) Neg Hx     Social History:  reports that he has quit smoking. His smoking  use included cigarettes. He has never used smokeless tobacco. He reports that he does not drink alcohol and does not use drugs.   Physical Exam: BP 121/63   Pulse (!) 55   Ht 6' (1.829 m)   Wt 222 lb 6.4 oz (100.9 kg)   BMI 30.16 kg/m   Constitutional:  Alert and oriented, No acute distress. HEENT: [place] AT, moist mucus membranes.  Trachea midline, no masses. Cardiovascular: No clubbing, cyanosis, or edema. Respiratory: Normal respiratory effort, no increased work of breathing. Skin: No rashes, bruises or suspicious lesions. Neurologic: Grossly intact, no focal deficits, moving all 4 extremities. Psychiatric: Normal mood and affect.  Assessment & Plan:    1. Prostate  cancer ([place]) NED on ADT as outlined above  71-month Depo of leuprolide given again today which he is tolerating well  Reminded of bone health recommendations including calcium and vitamin D  - PSA; Future - leuprolide (6 Month) (ELIGARD) injection 45 mg  2. Benign prostatic hyperplasia without lower urinary tract symptoms Asymptomatic on leuprolide and Flomax, with urinary symptoms well controlled  Follow-up 6 months with PSA for leuprolide    [name], MD  [facility] [address], [place], [place] [address] [phone]

## 2020-08-02 ENCOUNTER — Other Ambulatory Visit: Payer: Self-pay

## 2020-08-02 ENCOUNTER — Ambulatory Visit (INDEPENDENT_AMBULATORY_CARE_PROVIDER_SITE_OTHER): Payer: Medicare Other | Admitting: Podiatry

## 2020-08-02 ENCOUNTER — Encounter: Payer: Self-pay | Admitting: Podiatry

## 2020-08-02 DIAGNOSIS — M79675 Pain in left toe(s): Secondary | ICD-10-CM

## 2020-08-02 DIAGNOSIS — B351 Tinea unguium: Secondary | ICD-10-CM | POA: Diagnosis not present

## 2020-08-02 DIAGNOSIS — D689 Coagulation defect, unspecified: Secondary | ICD-10-CM | POA: Diagnosis not present

## 2020-08-02 DIAGNOSIS — E119 Type 2 diabetes mellitus without complications: Secondary | ICD-10-CM | POA: Diagnosis not present

## 2020-08-02 DIAGNOSIS — M79674 Pain in right toe(s): Secondary | ICD-10-CM

## 2020-08-02 NOTE — Progress Notes (Signed)
This patient returns to my office for at risk foot care.  This patient requires this care by a professional since this patient will be at risk due to having diabetes,chronic kidney disease and coagulation defect.  Patient is taking eliquiss.  This patient is unable to cut nails himself since the patient cannot reach his nails.These nails are painful walking and wearing shoes.  This patient presents for at risk foot care today.  General Appearance  Alert, conversant and in no acute stress.  Vascular  Dorsalis pedis and posterior tibial  pulses are palpable  bilaterally.  Capillary return is within normal limits  bilaterally. Temperature is within normal limits  bilaterally.  Neurologic  Senn-Weinstein monofilament wire test within normal limits  bilaterally. Muscle power within normal limits bilaterally.  Nails Thick disfigured discolored nails with subungual debris  from hallux to fifth toes bilaterally. No evidence of bacterial infection or drainage bilaterally.  Orthopedic  No limitations of motion  feet .  No crepitus or effusions noted.  No bony pathology or digital deformities noted.  Skin  normotropic skin with no porokeratosis noted bilaterally.  No signs of infections or ulcers noted.     Onychomycosis  Pain in right toes  Pain in left toes  Consent was obtained for treatment procedures.   Mechanical debridement of nails 1-5  bilaterally performed with a nail nipper.  Filed with dremel without incident.    Return office visit     3 months                Told patient to return for periodic foot care and evaluation due to potential at risk complications.   Gardiner Barefoot DPM

## 2020-08-08 ENCOUNTER — Ambulatory Visit: Payer: Medicare Other | Admitting: Family Medicine

## 2020-08-20 ENCOUNTER — Encounter: Payer: Self-pay | Admitting: Family Medicine

## 2020-08-20 ENCOUNTER — Ambulatory Visit (INDEPENDENT_AMBULATORY_CARE_PROVIDER_SITE_OTHER): Payer: Medicare Other | Admitting: Family Medicine

## 2020-08-20 ENCOUNTER — Other Ambulatory Visit: Payer: Self-pay

## 2020-08-20 VITALS — BP 132/86 | HR 90 | Temp 98.0°F | Resp 16 | Ht 72.0 in | Wt 215.2 lb

## 2020-08-20 DIAGNOSIS — E1129 Type 2 diabetes mellitus with other diabetic kidney complication: Secondary | ICD-10-CM

## 2020-08-20 DIAGNOSIS — I480 Paroxysmal atrial fibrillation: Secondary | ICD-10-CM | POA: Diagnosis not present

## 2020-08-20 DIAGNOSIS — E782 Mixed hyperlipidemia: Secondary | ICD-10-CM

## 2020-08-20 DIAGNOSIS — R809 Proteinuria, unspecified: Secondary | ICD-10-CM

## 2020-08-20 DIAGNOSIS — Z7901 Long term (current) use of anticoagulants: Secondary | ICD-10-CM | POA: Diagnosis not present

## 2020-08-20 DIAGNOSIS — L602 Onychogryphosis: Secondary | ICD-10-CM | POA: Diagnosis not present

## 2020-08-20 DIAGNOSIS — D689 Coagulation defect, unspecified: Secondary | ICD-10-CM

## 2020-08-20 DIAGNOSIS — L84 Corns and callosities: Secondary | ICD-10-CM

## 2020-08-20 DIAGNOSIS — N1831 Chronic kidney disease, stage 3a: Secondary | ICD-10-CM

## 2020-08-20 DIAGNOSIS — I10 Essential (primary) hypertension: Secondary | ICD-10-CM | POA: Diagnosis not present

## 2020-08-20 DIAGNOSIS — R399 Unspecified symptoms and signs involving the genitourinary system: Secondary | ICD-10-CM

## 2020-08-20 DIAGNOSIS — K219 Gastro-esophageal reflux disease without esophagitis: Secondary | ICD-10-CM

## 2020-08-20 MED ORDER — VALSARTAN-HYDROCHLOROTHIAZIDE 160-12.5 MG PO TABS: 1.0000 | ORAL_TABLET | Freq: Every day | ORAL | 1 refills | Status: AC

## 2020-08-20 MED ORDER — APIXABAN 2.5 MG PO TABS: 2.5000 mg | ORAL_TABLET | Freq: Two times a day (BID) | ORAL | 3 refills | Status: AC

## 2020-08-20 MED ORDER — TAMSULOSIN HCL 0.4 MG PO CAPS: 0.4000 mg | ORAL_CAPSULE | Freq: Every day | ORAL | 3 refills | Status: AC

## 2020-08-20 NOTE — Progress Notes (Signed)
Name: Charles Valencia   MRN: [id number]    DOB: [birth year]   Date:08/20/2020       Progress Note  Chief Complaint  Patient presents with   Hypertension   Hyperlipidemia    6 month follow up     Subjective:   Charles Valencia is a 87 y.o. male, presents to clinic for routine f/up  Hypertension:   hx of chronic afib on eliquis - sees Charles Valencia Currently managed on valsartan-HCTZ and eliquis Pt reports good med compliance and denies any SE.   Blood pressure today is well controlled. BP Readings from Last 3 Encounters:  08/20/20 132/86  07/24/20 121/63  02/09/20 136/84   Pt denies CP, SOB, exertional sx, palpitation, Ha's, visual disturbances, lightheadedness, hypotension, syncope.  Some LE edema bilaterally, Left > R, some peeling, chronic skin changes/rash, hyperpigmentation  Reviewed last cardiology OV and testing including NM myoview, ECG and ECHO on 5/18 and 05/24/20 - spent more than 5 min for chart review   HLD on crestor, taking daily, tolerating with SE or concerns, denies myalgias, claudication sx, CP Lab Results  Component Value Date   CHOL 122 07/26/2019   HDL 46 07/26/2019   LDLCALC 57 07/26/2019   TRIG 100 07/26/2019   CHOLHDL 2.7 07/26/2019  Due for repeat labs   DM:  diet controlled  Denies: Polyuria, polydipsia, vision changes, neuropathy, hypoglycemia Recent pertinent labs: Lab Results  Component Value Date   HGBA1C 7.3 (H) 02/09/2020   HGBA1C 7.0 (A) 11/01/2019   HGBA1C 7.2 (H) 07/26/2019   Lab Results  Component Value Date   MICROALBUR 5.5 01/25/2019   LDLCALC 57 07/26/2019   CREATININE 1.09 02/09/2020   Standard of care and health maintenance: Foot exam:  due in Nov 22 DM eye exam:  done ACEI/ARB:  yes valsartan Statin:  crestor 5 He goes to podiatry - visit 3 weeks ago - nails long and recent scab/irritation with shoe - scabs on left foot   Prostate CA/BPH with LUTS just did f/up with urology last month - reviewed Charles Valencia note  and labs:  PSA on 07/17/2020 is undetectable.  He continues on Flomax in addition to his ADT.  He remains asymptomatic and has no urinary complaints.  GERD - on nexium, takes daily, denies reflux, indigestion, abd pain, N, V, dysphagiaa    Current Outpatient Medications:    apixaban (ELIQUIS) 2.5 MG TABS tablet, Take 1 tablet (2.5 mg total) by mouth 2 (two) times daily., Disp: 180 tablet, Rfl: 1   aspirin EC 81 MG tablet, Take 81 mg by mouth daily. Swallow whole., Disp: , Rfl:    esomeprazole (NEXIUM) 40 MG capsule, Take 1 capsule (40 mg total) by mouth daily as needed (acid reflux and stomach protection). TAKE 1 CAPSULE BY MOUTH DAILY AT 12 NOON, Disp: 90 capsule, Rfl: 1   LUMIGAN 0.01 % SOLN, 1 drop at bedtime., Disp: , Rfl:    rosuvastatin (CRESTOR) 5 MG tablet, Take 1 tablet (5 mg total) by mouth daily., Disp: 90 tablet, Rfl: 3   tamsulosin (FLOMAX) 0.4 MG CAPS capsule, TAKE 1 CAPSULE BY MOUTH EVERY DAY, Disp: 30 capsule, Rfl: 11   valsartan-hydrochlorothiazide (DIOVAN-HCT) 160-12.5 MG tablet, Take 1 tablet by mouth daily., Disp: 90 tablet, Rfl: 1  Patient Active Problem List   Diagnosis Date Noted   Current use of long term anticoagulation 02/09/2020   Stage 3a chronic kidney disease ([place]) 08/24/2019   Coagulation disorder ([place]) 06/17/2018   Type 2 diabetes mellitus  with microalbuminuria, without long-term current use of insulin ([place]) 04/27/2017   Prostate cancer ([place]) 09/12/2016   GERD (gastroesophageal reflux disease) 09/13/2015   Hyperlipidemia 09/13/2015   Atrial fibrillation ([place]) 10/17/2014   Bradycardia 06/14/2014   Essential hypertension 06/14/2014   Sick sinus syndrome ([place]) 06/14/2014    No past surgical history on file.  Family History  Problem Relation Age of Onset   Diabetes Sister    Hyperlipidemia Sister    Hypertension Sister    Kidney disease Sister    Alzheimer's disease Sister    Bladder Cancer Neg Hx    Kidney cancer Neg Hx    AAA (abdominal aortic  aneurysm) Neg Hx     Social History   Tobacco Use   Smoking status: Former    Types: Cigarettes   Smokeless tobacco: Never   Tobacco comments:    smoking cessation materials not required; poor historian, unable to recall smoking history  Vaping Use   Vaping Use: Never used  Substance Use Topics   Alcohol use: No    Alcohol/week: 0.0 standard drinks   Drug use: No     No Known Allergies  Health Maintenance  Topic Date Due   COVID-19 Vaccine (3 - Pfizer risk series) 04/19/2019   INFLUENZA VACCINE  08/06/2020   HEMOGLOBIN A1C  08/08/2020   Zoster Vaccines- Shingrix (1 of 2) 10/05/2020 (Originally 01/08/1950)   FOOT EXAM  11/06/2020   OPHTHALMOLOGY EXAM  04/26/2021   TETANUS/TDAP  12/15/2021   PNA vac Low Risk Adult  Completed   HPV VACCINES  Aged Out    Chart Review Today: I personally reviewed active problem list, medication list, allergies, family history, social history, health maintenance, notes from last encounter, lab results, imaging with the patient/caregiver today.   Review of Systems  Reviewed and neg other than specified as acute pertinent positives in HPI Objective:   Vitals:   08/20/20 1451  BP: 132/86  Pulse: 90  Resp: 16  Temp: 98 F (36.7 C)  TempSrc: Oral  SpO2: 99%  Weight: 215 lb 3.2 oz (97.6 kg)  Height: 6' (1.829 m)    Body mass index is 29.19 kg/m.  Physical Exam Vitals and nursing note reviewed.  Constitutional:      General: He is not in acute distress.    Appearance: Normal appearance. He is well-developed and well-groomed. He is obese. He is not ill-appearing, toxic-appearing or diaphoretic.     Interventions: Face mask in place.     Comments: Elderly male, alert, NAD, pleasant  HENT:     Head: Normocephalic and atraumatic.     Right Ear: External ear normal.     Left Ear: External ear normal.  Eyes:     General: No scleral icterus.       Right eye: No discharge.        Left eye: No discharge.     Conjunctiva/sclera:  Conjunctivae normal.  Cardiovascular:     Rate and Rhythm: Normal rate. Rhythm irregularly irregular.     Pulses: Normal pulses.          Radial pulses are 2+ on the right side and 2+ on the left side.       Dorsalis pedis pulses are 2+ on the right side and 2+ on the left side.       Posterior tibial pulses are 2+ on the right side and 2+ on the left side.     Heart sounds: Normal heart sounds. No murmur heard.  No friction rub. No gallop.  Pulmonary:     Effort: Pulmonary effort is normal. No respiratory distress.     Breath sounds: Normal breath sounds. No stridor. No wheezing, rhonchi or rales.  Abdominal:     General: Bowel sounds are normal. There is no distension.     Palpations: Abdomen is soft.     Tenderness: There is no abdominal tenderness.  Musculoskeletal:     Right lower leg: Edema present.     Left lower leg: Edema present.  Skin:    General: Skin is warm.     Findings: Rash present.     Comments: B/L LE peeling dry flaking skin with indentation in edema from socks, hyperpigmentation b/l L>R, scattered reticular veins Left lateral foot 5th MTP area and lateral 5th toe callus with scabbing with some surrounding bruising? No surrounding erythema warmth, no ttp Right foot great toenail jagged and long, 3rd toenail curled and long  Neurological:     Mental Status: He is alert. Mental status is at baseline.  Psychiatric:        Mood and Affect: Mood normal.        Behavior: Behavior normal. Behavior is cooperative.     Diabetic Foot Exam - Simple   Simple Foot Form Diabetic Foot exam was performed with the following findings: Yes 08/20/2020  3:28 PM  Visual Inspection No deformities, no ulcerations, no other skin breakdown bilaterally: Yes Sensation Testing Intact to touch and monofilament testing bilaterally: Yes Pulse Check Posterior Tibialis and Dorsalis pulse intact bilaterally: Yes Comments Long curled toenails on right, great toe and 3rd toe Left foot with  scabs, callus to left lateral foot No erythema, no ttp        Assessment & Plan:     ICD-10-CM   1. Mixed hyperlipidemia  99991111 COMPLETE METABOLIC PANEL WITH GFR    Lipid panel   good statin compliance, no SE or concerns, due to labs    2. Essential hypertension  99991111 COMPLETE METABOLIC PANEL WITH GFR    valsartan-hydrochlorothiazide (DIOVAN-HCT) 160-12.5 MG tablet   stable, well controlled, BP at goal today     3. Type 2 diabetes mellitus with microalbuminuria, without long-term current use of insulin (HCC)  [id number] COMPLETE METABOLIC PANEL WITH GFR   R80.9 Lipid panel    Hemoglobin A1c   has been stable and well controlled and at goal with diet/lifestyle, recheck today, foot exam done    4. Paroxysmal atrial fibrillation (H[facility])  I48.0 apixaban (ELIQUIS) 2.5 MG TABS tablet    5. Stage 3a chronic kidney disease (HCC)  [id number] COMPLETE METABOLIC PANEL WITH GFR    6. Current use of long term anticoagulation  Z79.01 CBC with Differential/Platelet    apixaban (ELIQUIS) 2.5 MG TABS tablet   no bleeding or bruising concerns - monitoring    7. Gastroesophageal reflux disease, unspecified whether esophagitis present  K21.9    no abd pain, dysphagia     8. Lower urinary tract symptoms (LUTS)  R39.9 tamsulosin (FLOMAX) 0.4 MG CAPS capsule   managed by urology - stable PSA, refill on flomax, pt states sx are stable and well controlled    9. Long toenail  L60.2    see below    10. Callus of foot  L84    need f/up with podiatry to recheck callus's and trim nails    11. Coagulation disorder (HCC)  D68.9 apixaban (ELIQUIS) 2.5 MG TABS tablet     Pt neurovascularly intact to b/l  LE - edema and chronic skin changes I suspect are from chronic venus stasis and stasis dermatitis - will monitor  Ref to vascular if any sx develop or any complications  For now can follow with podiatry   Return in about 6 months (around 02/20/2021) for HTN, NOAC monitoring, DM .   Delsa Grana, PA-C 08/20/20  3:12 PM

## 2020-08-21 ENCOUNTER — Telehealth: Payer: Self-pay | Admitting: *Deleted

## 2020-08-21 ENCOUNTER — Other Ambulatory Visit: Payer: Self-pay | Admitting: Family Medicine

## 2020-08-21 DIAGNOSIS — Z7901 Long term (current) use of anticoagulants: Secondary | ICD-10-CM

## 2020-08-21 DIAGNOSIS — I480 Paroxysmal atrial fibrillation: Secondary | ICD-10-CM

## 2020-08-21 DIAGNOSIS — E876 Hypokalemia: Secondary | ICD-10-CM

## 2020-08-21 DIAGNOSIS — D689 Coagulation defect, unspecified: Secondary | ICD-10-CM

## 2020-08-21 LAB — COMPLETE METABOLIC PANEL WITH GFR
AG Ratio: 1.4 (calc) (ref 1.0–2.5)
ALT: 11 U/L (ref 9–46)
AST: 17 U/L (ref 10–35)
Albumin: 4.2 g/dL (ref 3.6–5.1)
Alkaline phosphatase (APISO): 86 U/L (ref 35–144)
BUN: 12 mg/dL (ref 7–25)
CO2: 29 mmol/L (ref 20–32)
Calcium: 9.5 mg/dL (ref 8.6–10.3)
Chloride: 102 mmol/L (ref 98–110)
Creat: 1.16 mg/dL (ref 0.70–1.22)
Globulin: 3 g/dL (calc) (ref 1.9–3.7)
Glucose, Bld: 102 mg/dL — ABNORMAL HIGH (ref 65–99)
Potassium: 3.2 mmol/L — ABNORMAL LOW (ref 3.5–5.3)
Sodium: 142 mmol/L (ref 135–146)
Total Bilirubin: 0.7 mg/dL (ref 0.2–1.2)
Total Protein: 7.2 g/dL (ref 6.1–8.1)
eGFR: 61 mL/min/{1.73_m2} (ref >=60)

## 2020-08-21 LAB — CBC WITH DIFFERENTIAL/PLATELET
Absolute Monocytes: 450 cells/uL (ref 200–950)
Basophils Absolute: 41 cells/uL (ref 0–200)
Basophils Relative: 0.9 %
Eosinophils Absolute: 320 cells/uL (ref 15–500)
Eosinophils Relative: 7.1 %
HCT: 40.8 % (ref 38.5–50.0)
Hemoglobin: 13.2 g/dL (ref 13.2–17.1)
Lymphs Abs: 1053 cells/uL (ref 850–3900)
MCH: 28.1 pg (ref 27.0–33.0)
MCHC: 32.4 g/dL (ref 32.0–36.0)
MCV: 87 fL (ref 80.0–100.0)
MPV: 11.4 fL (ref 7.5–12.5)
Monocytes Relative: 10 %
Neutro Abs: 2637 cells/uL (ref 1500–7800)
Neutrophils Relative %: 58.6 %
Platelets: 207 10*3/uL (ref 140–400)
RBC: 4.69 10*6/uL (ref 4.20–5.80)
RDW: 14.4 % (ref 11.0–15.0)
Total Lymphocyte: 23.4 %
WBC: 4.5 10*3/uL (ref 3.8–10.8)

## 2020-08-21 LAB — LIPID PANEL
Cholesterol: 116 mg/dL (ref <200)
HDL: 40 mg/dL (ref >=40)
LDL Cholesterol (Calc): 56 mg/dL (calc)
Non-HDL Cholesterol (Calc): 76 mg/dL (calc) (ref <130)
Total CHOL/HDL Ratio: 2.9 (calc) (ref <5.0)
Triglycerides: 115 mg/dL (ref <150)

## 2020-08-21 LAB — HEMOGLOBIN A1C
Hgb A1c MFr Bld: 7.3 % of total Hgb — ABNORMAL HIGH (ref <5.7)
Mean Plasma Glucose: 163 mg/dL
eAG (mmol/L): 9 mmol/L

## 2020-08-21 MED ORDER — POTASSIUM CHLORIDE CRYS ER 20 MEQ PO TBCR: 20.0000 meq | EXTENDED_RELEASE_TABLET | Freq: Every day | ORAL | 0 refills | Status: AC

## 2020-08-21 NOTE — Chronic Care Management (AMB) (Signed)
  Care Management   Note  08/21/2020 Name: [name] MRN: [id number] DOB: 03/31/1931  Charles Valencia is a 87 y.o. year old male who is a primary care patient of Laurell Roof and is actively engaged with the care management team. I reached out to [name] by phone today to assist with scheduling a follow up visit with the Pharmacist  Follow up plan: Unsuccessful telephone outreach attempt made. A HIPAA compliant phone message was left for the patient providing contact information and requesting a return call.  The care management team will reach out to the patient again over the next 7 days.  If patient returns call to provider office, please advise to call [facility]  at [phone].  [facility] Management  Direct Dial: [phone]

## 2020-08-22 NOTE — Chronic Care Management (AMB) (Signed)
  Chronic Care Management   Note  08/22/2020 Name: Charles Valencia MRN: [id number] DOB: [birth year]  Charles Valencia is a 87 y.o. year old male who is a primary care patient of Delsa Grana, [place]. I reached out to [name] by phone today in response to a referral sent by Charles Valencia's PCP, Delsa Grana, PA-C      Charles Valencia was given information about Chronic Care Management services today including:  CCM service includes personalized support from designated clinical staff supervised by his physician, including individualized plan of care and coordination with other care providers 24/7 contact phone numbers for assistance for urgent and routine care needs. Service will only be billed when office clinical staff spend 20 minutes or more in a month to coordinate care. Only one practitioner may furnish and bill the service in a calendar month. The patient may stop CCM services at any time (effective at the end of the month) by phone call to the office staff. The patient will be responsible for cost sharing (co-pay) of up to 20% of the service fee (after annual deductible is met).  Patient agreed to services and verbal consent obtained.   Follow up plan: Telephone appointment with care management team member scheduled for:09/26/20  [facility] Management  Direct Dial: [phone]

## 2020-08-27 ENCOUNTER — Telehealth: Payer: Self-pay

## 2020-08-31 NOTE — Telephone Encounter (Signed)
Charles Valencia with [facility] called saying the death Cert needs to be signed online.  The time has almost expired in the [place] [employer].  CB#  [phone]

## 2020-08-31 NOTE — Telephone Encounter (Signed)
Completed death certificate in [facility]  Sep 19, 2020  1:13 PM Delsa Grana, PA-C

## 2020-09-06 NOTE — Telephone Encounter (Signed)
Police officer from [place] called to inform patients passing,  please work on death certificate

## 2020-09-06 NOTE — Telephone Encounter (Signed)
officer Gilford Rile from [facility] called back states he was sent out for welfare check and patient was found dead in home at 9:03 this am.  Officer's # [phone] with any questions, if he does not answer please leave VM and he will call back.

## 2020-09-06 NOTE — Telephone Encounter (Signed)
Copied from [place] [id number]. Topic: General - Deceased Patient >> 2020/08/31 11:03 AM Yvette Rack wrote: Reason for CRM: Pt niece Landis Gandy called to report that they found the patient deceased this morning and she wanted to call to make the office aware.  Route to department's PEC Pool.

## 2020-09-06 DEATH — deceased

## 2020-09-26 ENCOUNTER — Telehealth: Payer: Medicare Other

## 2020-11-01 ENCOUNTER — Ambulatory Visit: Payer: Medicare Other | Admitting: Podiatry

## 2021-01-24 ENCOUNTER — Other Ambulatory Visit: Payer: Self-pay

## 2021-01-29 ENCOUNTER — Ambulatory Visit: Payer: Self-pay | Admitting: Urology

## 2021-02-21 ENCOUNTER — Ambulatory Visit: Payer: Medicare Other | Admitting: Family Medicine
